# Patient Record
Sex: Female | Born: 1955 | Race: White | Hispanic: No | Marital: Married | Smoking: Former smoker
Health system: Southern US, Community
[De-identification: ages and names within clinical notes are randomized; demographics above are authoritative.]

## PROBLEM LIST (undated history)

## (undated) DIAGNOSIS — K5900 Constipation, unspecified: Secondary | ICD-10-CM

## (undated) DIAGNOSIS — E785 Hyperlipidemia, unspecified: Secondary | ICD-10-CM

## (undated) DIAGNOSIS — M797 Fibromyalgia: Secondary | ICD-10-CM

## (undated) DIAGNOSIS — T7840XA Allergy, unspecified, initial encounter: Secondary | ICD-10-CM

## (undated) DIAGNOSIS — E039 Hypothyroidism, unspecified: Secondary | ICD-10-CM

## (undated) DIAGNOSIS — K219 Gastro-esophageal reflux disease without esophagitis: Secondary | ICD-10-CM

## (undated) DIAGNOSIS — Z91018 Allergy to other foods: Secondary | ICD-10-CM

## (undated) DIAGNOSIS — J301 Allergic rhinitis due to pollen: Secondary | ICD-10-CM

## (undated) DIAGNOSIS — M810 Age-related osteoporosis without current pathological fracture: Secondary | ICD-10-CM

## (undated) DIAGNOSIS — M1990 Unspecified osteoarthritis, unspecified site: Secondary | ICD-10-CM

## (undated) DIAGNOSIS — F419 Anxiety disorder, unspecified: Secondary | ICD-10-CM

## (undated) HISTORY — DX: Allergic rhinitis due to pollen: J30.1

## (undated) HISTORY — PX: CARPAL TUNNEL RELEASE: SHX101

## (undated) HISTORY — DX: Hypothyroidism, unspecified: E03.9

## (undated) HISTORY — DX: Constipation, unspecified: K59.00

## (undated) HISTORY — DX: Fibromyalgia: M79.7

## (undated) HISTORY — PX: SINUS EXPLORATION: SHX5214

## (undated) HISTORY — DX: Allergy to other foods: Z91.018

## (undated) HISTORY — DX: Hyperlipidemia, unspecified: E78.5

## (undated) HISTORY — DX: Age-related osteoporosis without current pathological fracture: M81.0

## (undated) HISTORY — DX: Allergy, unspecified, initial encounter: T78.40XA

## (undated) HISTORY — DX: Gastro-esophageal reflux disease without esophagitis: K21.9

## (undated) HISTORY — PX: TONSILLECTOMY: SUR1361

## (undated) HISTORY — PX: FRACTURE SURGERY: SHX138

## (undated) HISTORY — PX: TUBAL LIGATION: SHX77

## (undated) HISTORY — PX: CATARACT EXTRACTION: SUR2

## (undated) HISTORY — DX: Unspecified osteoarthritis, unspecified site: M19.90

## (undated) HISTORY — PX: APPENDECTOMY: SHX54

## (undated) HISTORY — DX: Anxiety disorder, unspecified: F41.9

---

## 2016-01-23 ENCOUNTER — Ambulatory Visit (HOSPITAL_BASED_OUTPATIENT_CLINIC_OR_DEPARTMENT_OTHER): Payer: 59 | Attending: Internal Medicine | Admitting: Internal Medicine

## 2016-01-23 VITALS — Ht 63.0 in | Wt 185.0 lb

## 2016-01-23 DIAGNOSIS — R5383 Other fatigue: Secondary | ICD-10-CM | POA: Insufficient documentation

## 2016-01-23 DIAGNOSIS — Z6833 Body mass index (BMI) 33.0-33.9, adult: Secondary | ICD-10-CM | POA: Diagnosis not present

## 2016-01-23 DIAGNOSIS — R51 Headache: Secondary | ICD-10-CM | POA: Insufficient documentation

## 2016-01-23 DIAGNOSIS — G4733 Obstructive sleep apnea (adult) (pediatric): Secondary | ICD-10-CM

## 2016-01-23 DIAGNOSIS — E669 Obesity, unspecified: Secondary | ICD-10-CM | POA: Insufficient documentation

## 2016-01-23 DIAGNOSIS — R0602 Shortness of breath: Secondary | ICD-10-CM

## 2016-01-23 DIAGNOSIS — R0683 Snoring: Secondary | ICD-10-CM | POA: Diagnosis not present

## 2016-01-29 ENCOUNTER — Other Ambulatory Visit (HOSPITAL_BASED_OUTPATIENT_CLINIC_OR_DEPARTMENT_OTHER): Payer: Self-pay

## 2016-01-29 DIAGNOSIS — R0683 Snoring: Secondary | ICD-10-CM

## 2016-01-29 DIAGNOSIS — R0602 Shortness of breath: Secondary | ICD-10-CM

## 2016-02-06 ENCOUNTER — Telehealth: Payer: Self-pay | Admitting: Internal Medicine

## 2016-02-06 NOTE — Telephone Encounter (Signed)
CY please advise of the sleep study results that the pt had done on 9/7.  Pt is very upset that this has not been sent to Dr. Durene CalHunter.  Please advise. thanks

## 2016-02-06 NOTE — Telephone Encounter (Signed)
I apologize. We got behind while I was out of town, but we usually try to get studies processed and reports out in 2 weeks. I will take care of it.

## 2016-02-06 NOTE — Telephone Encounter (Signed)
Called pt to make aware of below.  Pt is also requesting that she be sent a copy of her sleep study once it is read.  Verified address on file.  Sleep study is not yet loaded into pt's chart, will keep message open to follow up on and send once sleep study is available.

## 2016-02-07 DIAGNOSIS — R0602 Shortness of breath: Secondary | ICD-10-CM

## 2016-02-07 NOTE — Procedures (Signed)
   Patient Name: Charlott RakesBagwell, Tulip Study Date: 01/23/2016 Gender: Female D.O.B: 12-04-1955 Age (years): [age] Referring Provider: Alvina Filbertenise Hunter MD Height (inches): 63 Interpreting Physician: Jetty Duhamellinton Teairra Millar MD, ABSM Weight (lbs): 185 RPSGT: Cherylann ParrDubili, Fred BMI: 33 MRN: [id number] Neck Size: 15.00 CLINICAL INFORMATION Sleep Study Type: NPSG Indication for sleep study: Fatigue, Morning Headaches, Obesity, Snoring, Witnesses Apnea / Gasping During Sleep Epworth Sleepiness Score: 3  SLEEP STUDY TECHNIQUE As per the AASM Manual for the Scoring of Sleep and Associated Events v2.3 (April 2016) with a hypopnea requiring 4% desaturations. The channels recorded and monitored were frontal, central and occipital EEG, electrooculogram (EOG), submentalis EMG (chin), nasal and oral airflow, thoracic and abdominal wall motion, anterior tibialis EMG, snore microphone, electrocardiogram, and pulse oximetry.  MEDICATIONS Patient's medications include: charted for review Medications self-administered by patient during sleep study : Sleep medicine administered - ALDACTONE, BENADRYL,  TRAZODONE, VALTREX, ZANTAC.  SLEEP ARCHITECTURE The study was initiated at 10:09:24 PM and ended at 5:03:30 AM. Sleep onset time was 9.9 minutes and the sleep efficiency was 86.3%. The total sleep time was 357.2 minutes. Stage REM latency was 208.0 minutes. The patient spent 10.22% of the night in stage N1 sleep, 71.72% in stage N2 sleep, 6.02% in stage N3 and 12.04% in REM. Alpha intrusion was absent. Supine sleep was 10.78%. Wake after sleep onset 47 minutes  RESPIRATORY PARAMETERS The overall apnea/hypopnea index (AHI) was 7.6 per hour. There were 21 total apneas, including 15 obstructive, 6 central and 0 mixed apneas. There were 24 hypopneas and 28 RERAs. The AHI during Stage REM sleep was 18.1 per hour. AHI while supine was 46.8 per hour. The mean oxygen saturation was 94.33%. The minimum SpO2 during sleep was  88.00%. Loud snoring was noted during this study.  CARDIAC DATA The 2 lead EKG demonstrated sinus rhythm. The mean heart rate was 72.72 beats per minute. Other EKG findings include: None.  LEG MOVEMENT DATA The total PLMS were 4 with a resulting PLMS index of 0.67. Associated arousal with leg movement index was 0.0 .  IMPRESSIONS - Mild obstructive sleep apnea occurred during this study (AHI = 7.6/h). - Most obstructive apneas were noted while patient was sleeping on back. - No significant central sleep apnea occurred during this study (CAI = 1.0/h). - The patient had minimal or no oxygen desaturation during the study (Min O2 = 88.00%) - The patient snored with Loud snoring volume. - No cardiac abnormalities were noted during this study. - Clinically significant periodic limb movements did not occur during sleep. No significant associated arousals.  DIAGNOSIS - Obstructive Sleep Apnea (327.23 [G47.33 ICD-10])  RECOMMENDATIONS - Positional therapy avoiding supine position during sleep. - Very mild obstructive sleep apnea. Return to discuss treatment options. - Avoid alcohol, sedatives and other CNS depressants that may worsen sleep apnea and disrupt normal sleep architecture. - Sleep hygiene should be reviewed to assess factors that may improve sleep quality. - Weight management and regular exercise should be initiated or continued if appropriate.  [Electronically signed] 02/07/2016 02:03 PM  Jetty Duhamellinton Camreigh Michie MD, ABSM Diplomate, American Board of Sleep Medicine   NPI: [id number]  Waymon BudgeYOUNG,Steph Cheadle D Diplomate, American Board of Sleep Medicine  ELECTRONICALLY SIGNED ON:  02/07/2016, 1:59 PM [facility] PH: [phone]   FX: [phone] ACCREDITED BY THE AMERICAN ACADEMY OF SLEEP MEDICINE

## 2016-02-10 NOTE — Telephone Encounter (Signed)
Copies of sleep study and results mailed to patient. Nothing further needed.

## 2016-03-27 ENCOUNTER — Telehealth: Payer: Self-pay | Admitting: Internal Medicine

## 2016-03-27 NOTE — Telephone Encounter (Signed)
Office closed at time of call-will send to CY to advise on form on Monday.

## 2016-03-30 NOTE — Telephone Encounter (Signed)
This is not for me. I have never seen this patient, just read a sleep study ordered by Dr Egbert [place]ibaldiBird [place]. Patient needs to talk with that doctor about treatment.

## 2016-03-30 NOTE — Telephone Encounter (Signed)
Spoke with Carolyn Welch the [facility] office is closed and all calls are forwarded to her. She is aware that we are not the ordering provider for oral appliance and that we are [facility] stating so. Nothing more needed at this time.

## 2020-05-23 ENCOUNTER — Encounter: Payer: Self-pay | Admitting: Internal Medicine

## 2020-06-27 ENCOUNTER — Ambulatory Visit: Payer: 59 | Admitting: Nurse Practitioner

## 2020-07-11 ENCOUNTER — Encounter: Payer: Self-pay | Admitting: Internal Medicine

## 2020-07-11 ENCOUNTER — Ambulatory Visit: Payer: 59 | Admitting: Nurse Practitioner

## 2020-07-11 NOTE — Progress Notes (Deleted)
Primary Care Physician:  Alvina Filbert, MD Primary Gastroenterologist:  Dr. Marletta Lor  No chief complaint on file.   HPI:   Carolyn Welch is a 65 y.o. female who presents on referral from primary care for GERD and to schedule a colonoscopy.  Reviewed information associated with referral including ***.  No history of colonoscopy found in our system.  Today she states is doing okay overall.  No past medical history on file.  *** The histories are not reviewed yet. Please review them in the "History" navigator section and refresh this SmartLink.  No current outpatient medications on file.   No current facility-administered medications for this visit.    Allergies as of 07/11/2020  . (Not on File)    No family history on file.  Social History   Socioeconomic History  . Marital status: Unknown    Spouse name: Not on file  . Number of children: Not on file  . Years of education: Not on file  . Highest education level: Not on file  Occupational History  . Not on file  Tobacco Use  . Smoking status: Not on file  . Smokeless tobacco: Not on file  Substance and Sexual Activity  . Alcohol use: Not on file  . Drug use: Not on file  . Sexual activity: Not on file  Other Topics Concern  . Not on file  Social History Narrative  . Not on file   Social Determinants of Health   Financial Resource Strain: Not on file  Food Insecurity: Not on file  Transportation Needs: Not on file  Physical Activity: Not on file  Stress: Not on file  Social Connections: Not on file  Intimate Partner Violence: Not on file    Subjective:*** Review of Systems  Constitutional: Negative for chills, fever, malaise/fatigue and weight loss.  HENT: Negative for congestion and sore throat.   Respiratory: Negative for cough and shortness of breath.   Cardiovascular: Negative for chest pain and palpitations.  Gastrointestinal: Negative for abdominal pain, blood in stool, diarrhea, melena,  nausea and vomiting.  Musculoskeletal: Negative for joint pain and myalgias.  Skin: Negative for rash.  Neurological: Negative for dizziness and weakness.  Endo/Heme/Allergies: Does not bruise/bleed easily.  Psychiatric/Behavioral: Negative for depression. The patient is not nervous/anxious.   All other systems reviewed and are negative.      Objective: There were no vitals taken for this visit. Physical Exam Vitals and nursing note reviewed.  Constitutional:      General: She is not in acute distress.    Appearance: Normal appearance. She is well-developed. She is not ill-appearing, toxic-appearing or diaphoretic.  HENT:     Head: Normocephalic and atraumatic.     Nose: No congestion or rhinorrhea.  Eyes:     General: No scleral icterus. Cardiovascular:     Rate and Rhythm: Normal rate and regular rhythm.     Heart sounds: Normal heart sounds.  Pulmonary:     Effort: Pulmonary effort is normal. No respiratory distress.     Breath sounds: Normal breath sounds.  Abdominal:     General: Bowel sounds are normal.     Palpations: Abdomen is soft. There is no hepatomegaly, splenomegaly or mass.     Tenderness: There is no abdominal tenderness. There is no guarding or rebound.     Hernia: No hernia is present.  Skin:    General: Skin is warm and dry.     Coloration: Skin is not jaundiced.  Findings: No rash.  Neurological:     General: No focal deficit present.     Mental Status: She is alert and oriented to person, place, and time.  Psychiatric:        Attention and Perception: Attention normal.        Mood and Affect: Mood normal.        Speech: Speech normal.        Behavior: Behavior normal.        Thought Content: Thought content normal.        Cognition and Memory: Cognition and memory normal.      Assessment:  ***   Plan: ***    Thank you for allowing [place] to participate in the care of [facility]  Wynne Dust, DNP, AGNP-C Adult & Gerontological Nurse  Practitioner [facility]   07/11/2020 8:53 AM   Disclaimer: This note was dictated with voice recognition software. Similar sounding words can inadvertently be transcribed and may not be corrected upon review.

## 2020-07-18 ENCOUNTER — Encounter: Payer: Self-pay | Admitting: Nurse Practitioner

## 2020-07-18 ENCOUNTER — Ambulatory Visit: Payer: 59 | Admitting: Nurse Practitioner

## 2020-07-18 ENCOUNTER — Other Ambulatory Visit: Payer: Self-pay

## 2020-07-18 ENCOUNTER — Telehealth: Payer: Self-pay

## 2020-07-18 DIAGNOSIS — Z86010 Personal history of colonic polyps: Secondary | ICD-10-CM

## 2020-07-18 DIAGNOSIS — K5900 Constipation, unspecified: Secondary | ICD-10-CM | POA: Diagnosis not present

## 2020-07-18 DIAGNOSIS — K219 Gastro-esophageal reflux disease without esophagitis: Secondary | ICD-10-CM | POA: Diagnosis not present

## 2020-07-18 MED ORDER — SUPREP BOWEL PREP KIT 17.5-3.13-1.6 GM/177ML PO SOLN: 1.0000 | ORAL | 0 refills | Status: DC

## 2020-07-18 NOTE — Progress Notes (Signed)
Primary Care Physician:  Abran Richard, MD Primary Gastroenterologist:  Dr. Abbey Chatters  Chief Complaint  Patient presents with  . Gastroesophageal Reflux    Doing okay now since starting omeprazole  . Consult    TCS done 02/2010.    HPI:   Carolyn Welch is a 65 y.o. female who presents on referral from primary care for GERD and to schedule colonoscopy. No information was found associated with the referral.  No history of colonoscopy in our system.  Today she states she doing okay overall. She was having a lot of GERD symptoms but she has since been started on omeprazole 20 mg daily which has helped a lot. She does have a history of GERD symptoms. Stress is a typical trigger. Symptoms well managed at this point. Chronic constipation which she manages with OTC medications. She states her last colonoscopy was about 2011 at [facility], and she is currently due. Previous GI Dr. Justus Memory at [place] (ph: [phone]; fax [phone]). Denies abdominal pain, N/V, hematochezia, melena, fever, chills, unintentional weight loss. Denies URI or flu-like symptoms. Denies loss of sense of taste or smell. The patient has received COVID-19 vaccination(s). They have also had a booster. Denies chest pain, dyspnea, dizziness, lightheadedness, syncope, near syncope. Denies any other upper or lower GI symptoms.  Past Medical History:  Diagnosis Date  . GERD (gastroesophageal reflux disease)   . Hypothyroidism     History reviewed. No pertinent surgical history.  Current Outpatient Medications  Medication Sig Dispense Refill  . levothyroxine (SYNTHROID) 125 MCG tablet Take 125 mcg by mouth daily.    . Liothyronine Sodium (CYTOMEL PO) SR 54m once a day    . omeprazole (PRILOSEC) 20 MG capsule Take 20 mg by mouth daily.    .Marland Kitchenspironolactone (ALDACTONE) 100 MG tablet Take 100 mg by mouth 2 (two) times daily.    . traZODone (DESYREL) 100 MG tablet at bedtime.     No  current facility-administered medications for this visit.    Allergies as of 07/18/2020 - Review Complete 07/18/2020  Allergen Reaction Noted  . Clindamycin Hives 12/06/2018  . Codeine  02/01/2013  . Doxycycline Hives 02/01/2013  . Other Other (See Comments) 04/30/2020    History reviewed. No pertinent family history.  Social History   Socioeconomic History  . Marital status: Unknown    Spouse name: Not on file  . Number of children: Not on file  . Years of education: Not on file  . Highest education level: Not on file  Occupational History  . Not on file  Tobacco Use  . Smoking status: Former SResearch scientist (life sciences) . Smokeless tobacco: Never Used  Substance and Sexual Activity  . Alcohol use: Yes    Comment: socially  . Drug use: Never  . Sexual activity: Not on file  Other Topics Concern  . Not on file  Social History Narrative  . Not on file   Social Determinants of Health   Financial Resource Strain: Not on file  Food Insecurity: Not on file  Transportation Needs: Not on file  Physical Activity: Not on file  Stress: Not on file  Social Connections: Not on file  Intimate Partner Violence: Not on file    Subjective: Review of Systems  Constitutional: Negative for chills, fever, malaise/fatigue and weight loss.  HENT: Negative for congestion and sore throat.   Respiratory: Negative for cough and shortness of breath.   Cardiovascular: Negative for chest pain and palpitations.  Gastrointestinal: Positive for constipation (  chronic, self-managed) and heartburn (improved on omeprazole). Negative for abdominal pain, blood in stool, diarrhea, melena, nausea and vomiting.  Musculoskeletal: Negative for joint pain and myalgias.  Skin: Negative for rash.  Neurological: Negative for dizziness and weakness.  Endo/Heme/Allergies: Does not bruise/bleed easily.  Psychiatric/Behavioral: Negative for depression. The patient is not nervous/anxious.   All other systems reviewed and are  negative.      Objective: BP 108/62   Pulse 68   Temp (!) 96.8 F (36 C)   Ht _0  (1.6 m)   Wt 147 lb 12.8 oz (67 kg)   BMI 26.18 kg/m  Physical Exam Vitals and nursing note reviewed.  Constitutional:      General: She is not in acute distress.    Appearance: Normal appearance. She is well-developed and normal weight. She is not ill-appearing, toxic-appearing or diaphoretic.  HENT:     Head: Normocephalic and atraumatic.     Nose: No congestion or rhinorrhea.  Eyes:     General: No scleral icterus. Cardiovascular:     Rate and Rhythm: Normal rate and regular rhythm.     Heart sounds: Normal heart sounds.  Pulmonary:     Effort: Pulmonary effort is normal. No respiratory distress.     Breath sounds: Normal breath sounds.  Abdominal:     General: Bowel sounds are normal.     Palpations: Abdomen is soft. There is no hepatomegaly, splenomegaly or mass.     Tenderness: There is no abdominal tenderness. There is no guarding or rebound.     Hernia: No hernia is present.  Skin:    General: Skin is warm and dry.     Coloration: Skin is not jaundiced.     Findings: No rash.  Neurological:     General: No focal deficit present.     Mental Status: She is alert and oriented to person, place, and time.  Psychiatric:        Attention and Perception: Attention normal.        Mood and Affect: Mood normal.        Speech: Speech normal.        Behavior: Behavior normal.        Thought Content: Thought content normal.        Cognition and Memory: Cognition and memory normal.      Assessment:  Pleasant 65 year old female presents on referral from primary care for GERD and schedule colonoscopy.  She also notes constipation is chronic symptoms since childhood.  No red flag/warning signs or symptoms.  GERD: Patient is having significant GERD symptoms.  She does take a regular amount of NSAIDs due to fibromyalgia.  States Tylenol does not work.  She was started on omeprazole 20 mg  once daily which has helped her symptoms significantly.  However, she states she does not want to be on an acid blocker forever because "it can prevent you from absorbing things."  We discussed the likely insidious nature of her symptoms given ongoing NSAID use and she will likely need ongoing PPI to prevent erosions, ulcers, and significant symptoms.  We can further address in the future if she would like to try to come off her medication.  Constipation: She states she has had constipation since childhood, will go up to 2 to 3 days before bowel movement at which point she will use a suppository if needed to help have bowel movement.  She asked about medications for constipation and we discussed daily medication such as Linzess  or Amitiza.  States she does not want to be on a daily medication.  I recommended she continue her current regimen in the meantime she can contact us if she would like to start regular constipation management  Need for colonoscopy: Per the patient her last colonoscopy in [place], [place] with benign polyps and recommended 10-year repeat.  She states this completed about 2011 and she is currently due.  We will request previous colonoscopy report.  We will proceed with scheduling a colonoscopy at this time, further recommendations to follow.   Proceed with TCS on propofol/MAC with Dr. Abbey Chatters on propofol/MAC in near future: the risks, benefits, and alternatives have been discussed with the patient in detail. The patient states understanding and desires to proceed.  ASA 2 (based on limited history in Epic)   Plan: 1. Request previous colonoscopy from [facility] 2. Continue PPI for now 3. Notify us if desire to start regular constipation management medications 4. Colonoscopy as described above 5. Return for follow-up based on post procedure recommendations or as needed.    Thank you for allowing [place] to participate in the care of [facility]  Walden Field, DNP, AGNP-C Adult & Gerontological Nurse Practitioner [facility]   07/18/2020 10:30 AM   Disclaimer: This note was dictated with voice recognition software. Similar sounding words can inadvertently be transcribed and may not be corrected upon review.

## 2020-07-18 NOTE — Patient Instructions (Signed)
Your health issues we discussed today were:   GERD (reflux/heartburn): 1. I am glad your low dose of omeprazole is helping your symptoms 2. As we discussed, you will likely have symptoms if you stop your medication especially given the ongoing use of NSAIDs (such as ibuprofen) 3. Continue taking omeprazole for now 4. Call us or schedule a follow-up visit if you would like to discuss coming off your medication 5. You can also discuss this with your primary care as well who prescribed the medication  Constipation: 1. I am glad you are over-the-counter regimen seems to be helping your constipation 2. Let us know if you would like to discuss daily medications to help manage her constipation  Need for colonoscopy: 1. We will schedule colonoscopy for you 2. Further recommendations will follow your colonoscopy 3. Call us if you have any concerning symptoms such as blood in your stools  Overall I recommend:  1. Continue other current medications 2. Return for follow-up in 2 months 3. Call us for any questions or concerns   ---------------------------------------------------------------  I am glad you have gotten your COVID-19 vaccination!  Even though you are fully vaccinated you should continue to follow CDC and state/local guidelines.  ---------------------------------------------------------------   At [facility] we value your feedback. You may receive a survey about your visit today. Please share your experience as we strive to create trusting relationships with our patients to provide genuine, compassionate, quality care.  We appreciate your understanding and patience as we review any laboratory studies, imaging, and other diagnostic tests that are ordered as we care for you. Our office policy is 5 business days for review of these results, and any emergent or urgent results are addressed in a timely manner for your best interest. If you do not hear from our office in 1  week, please contact us.   We also encourage the use of MyChart, which contains your medical information for your review as well. If you are not enrolled in this feature, an access code is on this after visit summary for your convenience. Thank you for allowing [place] to be involved in your care.  It was great to see you today!  I hope you have a great spring!!

## 2020-07-18 NOTE — Telephone Encounter (Signed)
PA for TCS submitted via [facility] website. PA# [id number], valid 08/26/20-11/24/20.

## 2020-08-01 ENCOUNTER — Telehealth: Payer: Self-pay | Admitting: Internal Medicine

## 2020-08-01 NOTE — Telephone Encounter (Signed)
Called pt. She has been rescheduled to 4/25 Monday. Aware will mail new prep instructions with new covid test appt. Called endo and LMOVM of appt change.

## 2020-08-01 NOTE — Telephone Encounter (Signed)
PATIENT NEEDS TO RESCHEDULE HER COLONOSCOPY

## 2020-08-23 ENCOUNTER — Other Ambulatory Visit (HOSPITAL_COMMUNITY): Payer: 59

## 2020-09-06 ENCOUNTER — Other Ambulatory Visit (HOSPITAL_COMMUNITY)
Admission: RE | Admit: 2020-09-06 | Discharge: 2020-09-06 | Disposition: A | Discharge status: Home / Self Care | Payer: 59 | Source: Ambulatory Visit | Attending: Internal Medicine | Admitting: Internal Medicine

## 2020-09-06 ENCOUNTER — Other Ambulatory Visit: Payer: Self-pay

## 2020-09-06 DIAGNOSIS — Z20822 Contact with and (suspected) exposure to covid-19: Secondary | ICD-10-CM | POA: Diagnosis not present

## 2020-09-06 DIAGNOSIS — Z01812 Encounter for preprocedural laboratory examination: Secondary | ICD-10-CM | POA: Diagnosis not present

## 2020-09-06 LAB — SARS CORONAVIRUS 2 (TAT 6-24 HRS): SARS Coronavirus 2: NEGATIVE

## 2020-09-06 NOTE — Progress Notes (Signed)
Pt.'s Covid order had not been put in the system. I put it in, released it and printed requisition. The specimen will go to the lab around 10:00. Pt. Has to have Covid test before her procedure can be done.

## 2020-09-09 ENCOUNTER — Ambulatory Visit (HOSPITAL_COMMUNITY): Payer: 59 | Admitting: Anesthesiology

## 2020-09-09 ENCOUNTER — Other Ambulatory Visit: Payer: Self-pay

## 2020-09-09 ENCOUNTER — Ambulatory Visit (HOSPITAL_COMMUNITY)
Admission: RE | Admit: 2020-09-09 | Discharge: 2020-09-09 | Disposition: A | Discharge status: Home / Self Care | Payer: 59 | Attending: Internal Medicine | Admitting: Internal Medicine

## 2020-09-09 ENCOUNTER — Encounter (HOSPITAL_COMMUNITY)
Admission: RE | Disposition: A | Discharge status: Home / Self Care | Payer: Self-pay | Source: Home / Self Care | Attending: Internal Medicine

## 2020-09-09 ENCOUNTER — Encounter (HOSPITAL_COMMUNITY): Payer: Self-pay

## 2020-09-09 DIAGNOSIS — Z1211 Encounter for screening for malignant neoplasm of colon: Secondary | ICD-10-CM | POA: Insufficient documentation

## 2020-09-09 DIAGNOSIS — Z79890 Hormone replacement therapy (postmenopausal): Secondary | ICD-10-CM | POA: Insufficient documentation

## 2020-09-09 DIAGNOSIS — Z79899 Other long term (current) drug therapy: Secondary | ICD-10-CM | POA: Diagnosis not present

## 2020-09-09 DIAGNOSIS — Z87891 Personal history of nicotine dependence: Secondary | ICD-10-CM | POA: Diagnosis not present

## 2020-09-09 DIAGNOSIS — Z881 Allergy status to other antibiotic agents status: Secondary | ICD-10-CM | POA: Insufficient documentation

## 2020-09-09 DIAGNOSIS — Z885 Allergy status to narcotic agent status: Secondary | ICD-10-CM | POA: Diagnosis not present

## 2020-09-09 DIAGNOSIS — K648 Other hemorrhoids: Secondary | ICD-10-CM | POA: Insufficient documentation

## 2020-09-09 DIAGNOSIS — K5730 Diverticulosis of large intestine without perforation or abscess without bleeding: Secondary | ICD-10-CM | POA: Diagnosis not present

## 2020-09-09 HISTORY — PX: COLONOSCOPY WITH PROPOFOL: SHX5780

## 2020-09-09 SURGERY — COLONOSCOPY WITH PROPOFOL
Anesthesia: General

## 2020-09-09 MED ORDER — PROPOFOL 500 MG/50ML IV EMUL
INTRAVENOUS | Status: DC | PRN
  Administered 2020-09-09: 125 ug/kg/min via INTRAVENOUS

## 2020-09-09 MED ORDER — LACTATED RINGERS IV SOLN: INTRAVENOUS | Status: DC

## 2020-09-09 MED ORDER — STERILE WATER FOR IRRIGATION IR SOLN
Status: DC | PRN
  Administered 2020-09-09: 1.5 mL

## 2020-09-09 MED ORDER — EPHEDRINE SULFATE 50 MG/ML IJ SOLN
INTRAMUSCULAR | Status: DC | PRN
  Administered 2020-09-09 (×2): 10 mg via INTRAVENOUS

## 2020-09-09 MED ORDER — PROPOFOL 10 MG/ML IV BOLUS
INTRAVENOUS | Status: DC | PRN
  Administered 2020-09-09: 100 mg via INTRAVENOUS
  Administered 2020-09-09: 20 mg via INTRAVENOUS

## 2020-09-09 NOTE — H&P (Signed)
Primary Care Physician:  Abran Richard, MD Primary Gastroenterologist:  Dr. Abbey Chatters  Pre-Procedure History & Physical: HPI:  Carolyn Welch is a 65 y.o. female is here for a colonoscopy for colon cancer screening purposes. Last TCS in 2011 in [place], [place] normal per patient.  Patient denies any family history of colorectal cancer.  No melena or hematochezia.  No abdominal pain or unintentional weight loss.  No change in bowel habits.  Overall feels well from a GI standpoint.  Past Medical History:  Diagnosis Date  . GERD (gastroesophageal reflux disease)   . Hypothyroidism     History reviewed. No pertinent surgical history.  Prior to Admission medications   Medication Sig Start Date End Date Taking? Authorizing Provider  Calcium Citrate-Vitamin D (CITRACAL + D PO) Take 1 tablet by mouth daily.   Yes [provider]  levothyroxine (SYNTHROID) 125 MCG tablet Take 125 mcg by mouth daily before breakfast. 07/04/20  Yes [provider]  liothyronine (CYTOMEL) 25 MCG tablet Take 25 mcg by mouth daily. SR 69m once a day   Yes [provider]  magnesium oxide (MAG-OX) 400 MG tablet Take 400 mg by mouth daily.   Yes [provider]  Na Sulfate-K Sulfate-Mg Sulf (SUPREP BOWEL PREP KIT) 17.5-3.13-1.6 GM/177ML SOLN Take 1 kit by mouth as directed. 07/18/20  Yes Deron Poole K, DO  Romosozumab-aqqg (EVENITY) 105 MG/1.17ML SOSY injection Inject 210 mg into the skin every 30 (thirty) days.   Yes [provider]  spironolactone (ALDACTONE) 100 MG tablet Take 100 mg by mouth 2 (two) times daily. 06/26/20  Yes [provider]  traZODone (DESYREL) 50 MG tablet Take 75 mg by mouth at bedtime. 06/24/20  Yes [provider]  venlafaxine XR (EFFEXOR-XR) 37.5 MG 24 hr capsule Take 37.5 mg by mouth daily. 08/19/20  Yes [provider]    Allergies as of 07/18/2020 - Review Complete 07/18/2020  Allergen Reaction Noted  . Clindamycin Hives 12/06/2018   . Codeine  02/01/2013  . Doxycycline Hives 02/01/2013  . Other Other (See Comments) 04/30/2020    History reviewed. No pertinent family history.  Social History   Socioeconomic History  . Marital status: Married    Spouse name: Not on file  . Number of children: Not on file  . Years of education: Not on file  . Highest education level: Not on file  Occupational History  . Not on file  Tobacco Use  . Smoking status: Former SResearch scientist (life sciences) . Smokeless tobacco: Never Used  Substance and Sexual Activity  . Alcohol use: Yes    Comment: socially  . Drug use: Never  . Sexual activity: Not on file  Other Topics Concern  . Not on file  Social History Narrative  . Not on file   Social Determinants of Health   Financial Resource Strain: Not on file  Food Insecurity: Not on file  Transportation Needs: Not on file  Physical Activity: Not on file  Stress: Not on file  Social Connections: Not on file  Intimate Partner Violence: Not on file    Review of Systems: See HPI, otherwise negative ROS  Physical Exam: Vital signs in last 24 hours: Temp:  [98.3 F (36.8 C)] 98.3 F (36.8 C) (04/25 0821) Pulse Rate:  [67] 67 (04/25 0821) Resp:  [20] 20 (04/25 0821) BP: (120)/(68) 120/68 (04/25 0821) SpO2:  [100 %] 100 % (04/25 0821) Weight:  [64.4 kg] 64.4 kg (04/25 0821)   General:   Alert,  Well-developed,  well-nourished, pleasant and cooperative in NAD Head:  Normocephalic and atraumatic. Eyes:  Sclera clear, no icterus.   Conjunctiva pink. Ears:  Normal auditory acuity. Nose:  No deformity, discharge,  or lesions. Mouth:  No deformity or lesions, dentition normal. Neck:  Supple; no masses or thyromegaly. Lungs:  Clear throughout to auscultation.   No wheezes, crackles, or rhonchi. No acute distress. Heart:  Regular rate and rhythm; no murmurs, clicks, rubs,  or gallops. Abdomen:  Soft, nontender and nondistended. No masses, hepatosplenomegaly or hernias noted. Normal bowel sounds,  without guarding, and without rebound.   Msk:  Symmetrical without gross deformities. Normal posture. Extremities:  Without clubbing or edema. Neurologic:  Alert and  oriented x4;  grossly normal neurologically. Skin:  Intact without significant lesions or rashes. Cervical Nodes:  No significant cervical adenopathy. Psych:  Alert and cooperative. Normal mood and affect.  Impression/Plan: Carolyn Welch is here for a colonoscopy to be performed for colon cancer screening purposes.  The risks of the procedure including infection, bleed, or perforation as well as benefits, limitations, alternatives and imponderables have been reviewed with the patient. Questions have been answered. All parties agreeable.

## 2020-09-09 NOTE — Anesthesia Preprocedure Evaluation (Signed)
Anesthesia Evaluation  Patient identified by MRN, date of birth, ID band Patient awake    Reviewed: Allergy & Precautions, NPO status , Patient's Chart, lab work & pertinent test results  Airway Mallampati: II  TM Distance: >3 FB Neck ROM: Full    Dental  (+) Dental Advisory Given,  Bridge :   Pulmonary former smoker,    Pulmonary exam normal breath sounds clear to auscultation       Cardiovascular Exercise Tolerance: Good Normal cardiovascular exam Rhythm:Regular Rate:Normal     Neuro/Psych negative neurological ROS  negative psych ROS   GI/Hepatic Neg liver ROS, GERD  Medicated and Controlled,  Endo/Other  Hypothyroidism   Renal/GU negative Renal ROS     Musculoskeletal negative musculoskeletal ROS (+)   Abdominal   Peds  Hematology negative hematology ROS (+)   Anesthesia Other Findings   Reproductive/Obstetrics negative OB ROS                             Anesthesia Physical Anesthesia Plan  ASA: II  Anesthesia Plan: General   Post-op Pain Management:    Induction: Intravenous  PONV Risk Score and Plan:   Airway Management Planned: Nasal Cannula and Natural Airway  Additional Equipment:   Intra-op Plan:   Post-operative Plan:   Informed Consent: I have reviewed the patients History and Physical, chart, labs and discussed the procedure including the risks, benefits and alternatives for the proposed anesthesia with the patient or authorized representative who has indicated his/her understanding and acceptance.     Dental advisory given  Plan Discussed with: CRNA and Surgeon  Anesthesia Plan Comments:         Anesthesia Quick Evaluation

## 2020-09-09 NOTE — Op Note (Signed)
[facility] Patient Name: Carolyn Welch Procedure Date: 09/09/2020 9:26 AM MRN: [id number] Date of Birth: 04-Jan-1956 Attending MD: Elon Alas. Abbey Chatters DO CSN: [id number] Age: 65 Admit Type: Outpatient Procedure:                Colonoscopy Indications:              Screening for colorectal malignant neoplasm Providers:                Elon Alas. Abbey Chatters, DO, Tacy Learn,                            Technician Referring MD:              Medicines:                See the Anesthesia note for documentation of the                            administered medications Complications:            No immediate complications. Estimated Blood Loss:     Estimated blood loss: none. Procedure:                Pre-Anesthesia Assessment:                           - The anesthesia plan was to use monitored                            anesthesia care (MAC).                           After obtaining informed consent, the colonoscope                            was passed under direct vision. Throughout the                            procedure, the patient's blood pressure, pulse, and                            oxygen saturations were monitored continuously. The                            [id number]-[id number] ([id number]) scope was introduced through                            the anus and advanced to the the cecum, identified                            by appendiceal orifice and ileocecal valve. The                            colonoscopy was performed without difficulty. The                            patient tolerated the procedure well. The quality  of the bowel preparation was evaluated using the                            BBPS [facility] Bowel Preparation Scale) with scores                            of: Right Colon = 2 (minor amount of residual                            staining, small fragments of stool and/or opaque                            liquid, but mucosa seen well),  Transverse Colon = 2                            (minor amount of residual staining, small fragments                            of stool and/or opaque liquid, but mucosa seen                            well) and Left Colon = 2 (minor amount of residual                            staining, small fragments of stool and/or opaque                            liquid, but mucosa seen well). The total BBPS score                            equals 6. The quality of the bowel preparation was                            fair. Scope In: 9:31:37 AM Scope Out: 9:45:05 AM Scope Withdrawal Time: 0 hours 8 minutes 36 seconds  Total Procedure Duration: 0 hours 13 minutes 28 seconds  Findings:      The perianal and digital rectal examinations were normal.      Non-bleeding internal hemorrhoids were found during endoscopy.      Multiple small and large-mouthed diverticula were found in the sigmoid       colon and descending colon. Impression:               - Preparation of the colon was fair.                           - Non-bleeding internal hemorrhoids.                           - Diverticulosis in the sigmoid colon and in the                            descending colon.                           -  No specimens collected. Moderate Sedation:      Per Anesthesia Care Recommendation:           - Patient has a contact number available for                            emergencies. The signs and symptoms of potential                            delayed complications were discussed with the                            patient. Return to normal activities tomorrow.                            Written discharge instructions were provided to the                            patient.                           - Resume previous diet.                           - Continue present medications.                           - Repeat colonoscopy in 10 years for screening                            purposes.                            - Return to GI clinic PRN. Procedure Code(s):        --- Professional ---                           T7001, Colorectal cancer screening; colonoscopy on                            individual not meeting criteria for high risk Diagnosis Code(s):        --- Professional ---                           Z12.11, Encounter for screening for malignant                            neoplasm of colon                           K64.8, Other hemorrhoids                           K57.30, Diverticulosis of large intestine without                            perforation or abscess without bleeding CPT copyright 2019 [employer].  All rights reserved. The codes documented in this report are preliminary and upon coder review may  be revised to meet current compliance requirements. Elon Alas. Abbey Chatters, DO Carlin Abbey Chatters, DO 09/09/2020 9:48:54 AM This report has been signed electronically. Number of Addenda: 0

## 2020-09-09 NOTE — Transfer of Care (Signed)
Immediate Anesthesia Transfer of Care Note  Patient: Carolyn Welch  Procedure(s) Performed: COLONOSCOPY WITH PROPOFOL (N/A )  Patient Location: PACU  Anesthesia Type:General  Level of Consciousness: awake and alert   Airway & Oxygen Therapy: Patient Spontanous Breathing  Post-op Assessment: Report given to RN and Post -op Vital signs reviewed and stable  Post vital signs: Reviewed and stable  Last Vitals:  Vitals Value Taken Time  BP    Temp    Pulse    Resp    SpO2      Last Pain:  Vitals:   09/09/20 0928  TempSrc:   PainSc: 0-No pain      Patients Stated Pain Goal: 7 (09/09/20 0821)  Complications: No complications documented.

## 2020-09-09 NOTE — Discharge Instructions (Addendum)
Colonoscopy Discharge Instructions  Read the instructions outlined below and refer to this sheet in the next few weeks. These discharge instructions provide you with general information on caring for yourself after you leave the hospital. Your doctor may also give you specific instructions. While your treatment has been planned according to the most current medical practices available, unavoidable complications occasionally occur.   ACTIVITY  You may resume your regular activity, but move at a slower pace for the next 24 hours.   Take frequent rest periods for the next 24 hours.   Walking will help get rid of the air and reduce the bloated feeling in your belly (abdomen).   No driving for 24 hours (because of the medicine (anesthesia) used during the test).    Do not sign any important legal documents or operate any machinery for 24 hours (because of the anesthesia used during the test).  NUTRITION  Drink plenty of fluids.   You may resume your normal diet as instructed by your doctor.   Begin with a light meal and progress to your normal diet. Heavy or fried foods are harder to digest and may make you feel sick to your stomach (nauseated).   Avoid alcoholic beverages for 24 hours or as instructed.  MEDICATIONS  You may resume your normal medications unless your doctor tells you otherwise.  WHAT YOU CAN EXPECT TODAY  Some feelings of bloating in the abdomen.   Passage of more gas than usual.   Spotting of blood in your stool or on the toilet paper.  IF YOU HAD POLYPS REMOVED DURING THE COLONOSCOPY:  No aspirin products for 7 days or as instructed.   No alcohol for 7 days or as instructed.   Eat a soft diet for the next 24 hours.  FINDING OUT THE RESULTS OF YOUR TEST Not all test results are available during your visit. If your test results are not back during the visit, make an appointment with your caregiver to find out the results. Do not assume everything is normal if  you have not heard from your caregiver or the medical facility. It is important for you to follow up on all of your test results.  SEEK IMMEDIATE MEDICAL ATTENTION IF:  You have more than a spotting of blood in your stool.   Your belly is swollen (abdominal distention).   You are nauseated or vomiting.   You have a temperature over 101.   You have abdominal pain or discomfort that is severe or gets worse throughout the day.   Your colonoscopy was relatively unremarkable.  I did not find any polyps or evidence of colon cancer.  You do have  diverticulosis and internal hemorrhoids. I would recommend increasing fiber in your diet or adding OTC Benefiber/Metamucil. Be sure to drink at least 4 to 6 glasses of water daily. Follow-up with GI as needed.   I hope you have a great rest of your week!  Hennie Duos. Marletta Lor, D.O. Gastroenterology and Hepatology [facility]   Diverticulosis  Diverticulosis is a condition that develops when small pouches (diverticula) form in the wall of the large intestine (colon). The colon is where water is absorbed and stool (feces) is formed. The pouches form when the inside layer of the colon pushes through weak spots in the outer layers of the colon. You may have a few pouches or many of them. The pouches usually do not cause problems unless they become inflamed or infected. When this happens, the condition  is called diverticulitis. What are the causes? The cause of this condition is not known. What increases the risk? The following factors may make you more likely to develop this condition:  Being older than age 64. Your risk for this condition increases with age. Diverticulosis is rare among people younger than age 64. By age 64, many people have it.  Eating a low-fiber diet.  Having frequent constipation.  Being overweight.  Not getting enough exercise.  Smoking.  Taking over-the-counter pain medicines, like aspirin and  ibuprofen.  Having a family history of diverticulosis. What are the signs or symptoms? In most people, there are no symptoms of this condition. If you do have symptoms, they may include:  Bloating.  Cramps in the abdomen.  Constipation or diarrhea.  Pain in the lower left side of the abdomen. How is this diagnosed? Because diverticulosis usually has no symptoms, it is most often diagnosed during an exam for other colon problems. The condition may be diagnosed by:  Using a flexible scope to examine the colon (colonoscopy).  Taking an X-ray of the colon after dye has been put into the colon (barium enema).  Having a CT scan. How is this treated? You may not need treatment for this condition. Your health care provider may recommend treatment to prevent problems. You may need treatment if you have symptoms or if you previously had diverticulitis. Treatment may include:  Eating a high-fiber diet.  Taking a fiber supplement.  Taking a live bacteria supplement (probiotic).  Taking medicine to relax your colon.   Follow these instructions at home: Medicines  Take over-the-counter and prescription medicines only as told by your health care provider.  If told by your health care provider, take a fiber supplement or probiotic. Constipation prevention Your condition may cause constipation. To prevent or treat constipation, you may need to:  Drink enough fluid to keep your urine pale yellow.  Take over-the-counter or prescription medicines.  Eat foods that are high in fiber, such as beans, whole grains, and fresh fruits and vegetables.  Limit foods that are high in fat and processed sugars, such as fried or sweet foods.   General instructions  Try not to strain when you have a bowel movement.  Keep all follow-up visits as told by your health care provider. This is important. Contact a health care provider if you:  Have pain in your abdomen.  Have bloating.  Have  cramps.  Have not had a bowel movement in 3 days. Get help right away if:  Your pain gets worse.  Your bloating becomes very bad.  You have a fever or chills, and your symptoms suddenly get worse.  You vomit.  You have bowel movements that are bloody or black.  You have bleeding from your rectum. Summary  Diverticulosis is a condition that develops when small pouches (diverticula) form in the wall of the large intestine (colon).  You may have a few pouches or many of them.  This condition is most often diagnosed during an exam for other colon problems.  Treatment may include increasing the fiber in your diet, taking supplements, or taking medicines. This information is not intended to replace advice given to you by your health care provider. Make sure you discuss any questions you have with your health care provider. Document Revised: 12/01/2018 Document Reviewed: 12/01/2018 Elsevier Patient Education  2021 [employer]  Hemorrhoids Hemorrhoids are swollen veins that may develop:  In the butt (rectum). These are called internal hemorrhoids.  Around the opening of the butt (anus). These are called external hemorrhoids. Hemorrhoids can cause pain, itching, or bleeding. Most of the time, they do not cause serious problems. They usually get better with diet changes, lifestyle changes, and other home treatments. What are the causes? This condition may be caused by:  Having trouble pooping (constipation).  Pushing hard (straining) to poop.  Watery poop (diarrhea).  Pregnancy.  Being very overweight (obese).  Sitting for long periods of time.  Heavy lifting or other activity that causes you to strain.  Anal sex.  Riding a bike for a long period of time. What are the signs or symptoms? Symptoms of this condition include:  Pain.  Itching or soreness in the butt.  Bleeding from the butt.  Leaking poop.  Swelling in the area.  One or more lumps around the  opening of your butt. How is this diagnosed? A doctor can often diagnose this condition by looking at the affected area. The doctor may also:  Do an exam that involves feeling the area with a gloved hand (digital rectal exam).  Examine the area inside your butt using a small tube (anoscope).  Order blood tests. This may be done if you have lost a lot of blood.  Have you get a test that involves looking inside the colon using a flexible tube with a camera on the end (sigmoidoscopy or colonoscopy). How is this treated? This condition can usually be treated at home. Your doctor may tell you to change what you eat, make lifestyle changes, or try home treatments. If these do not help, procedures can be done to remove the hemorrhoids or make them smaller. These may involve:  Placing rubber bands at the base of the hemorrhoids to cut off their blood supply.  Injecting medicine into the hemorrhoids to shrink them.  Shining a type of light energy onto the hemorrhoids to cause them to fall off.  Doing surgery to remove the hemorrhoids or cut off their blood supply. Follow these instructions at home: Eating and drinking  Eat foods that have a lot of fiber in them. These include whole grains, beans, nuts, fruits, and vegetables.  Ask your doctor about taking products that have added fiber (fibersupplements).  Reduce the amount of fat in your diet. You can do this by: ? Eating low-fat dairy products. ? Eating less red meat. ? Avoiding processed foods.  Drink enough fluid to keep your pee (urine) pale yellow.   Managing pain and swelling  Take a warm-water bath (sitz bath) for 20 minutes to ease pain. Do this 3-4 times a day. You may do this in a bathtub or using a portable sitz bath that fits over the toilet.  If told, put ice on the painful area. It may be helpful to use ice between your warm baths. ? Put ice in a plastic bag. ? Place a towel between your skin and the bag. ? Leave the  ice on for 20 minutes, 2-3 times a day.   General instructions  Take over-the-counter and prescription medicines only as told by your doctor. ? Medicated creams and medicines may be used as told.  Exercise often. Ask your doctor how much and what kind of exercise is best for you.  Go to the bathroom when you have the urge to poop. Do not wait.  Avoid pushing too hard when you poop.  Keep your butt dry and clean. Use wet toilet paper or moist towelettes after pooping.  Do not  sit on the toilet for a long time.  Keep all follow-up visits as told by your doctor. This is important. Contact a doctor if you:  Have pain and swelling that do not get better with treatment or medicine.  Have trouble pooping.  Cannot poop.  Have pain or swelling outside the area of the hemorrhoids. Get help right away if you have:  Bleeding that will not stop. Summary  Hemorrhoids are swollen veins in the butt or around the opening of the butt.  They can cause pain, itching, or bleeding.  Eat foods that have a lot of fiber in them. These include whole grains, beans, nuts, fruits, and vegetables.  Take a warm-water bath (sitz bath) for 20 minutes to ease pain. Do this 3-4 times a day. This information is not intended to replace advice given to you by your health care provider. Make sure you discuss any questions you have with your health care provider. Document Revised: 05/12/2018 Document Reviewed: 09/23/2017 Elsevier Patient Education  2021 [employer].  High-Fiber Eating Plan Fiber, also called dietary fiber, is a type of carbohydrate. It is found foods such as fruits, vegetables, whole grains, and beans. A high-fiber diet can have many health benefits. Your health care provider may recommend a high-fiber diet to help:  Prevent constipation. Fiber can make your bowel movements more regular.  Lower your cholesterol.  Relieve the following conditions: ? Inflammation of veins in the anus  (hemorrhoids). ? Inflammation of specific areas of the digestive tract (uncomplicated diverticulosis). ? A problem of the large intestine, also called the colon, that sometimes causes pain and diarrhea (irritable bowel syndrome, or IBS).  Prevent overeating as part of a weight-loss plan.  Prevent heart disease, type 2 diabetes, and certain cancers. What are tips for following this plan? Reading food labels  Check the nutrition facts label on food products for the amount of dietary fiber. Choose foods that have 5 grams of fiber or more per serving.  The goals for recommended daily fiber intake include: ? Men (age 29 or younger): 34-38 g. ? Men (over age 11): 28-34 g. ? Women (age 22 or younger): 25-28 g. ? Women (over age 43): 22-25 g. Your daily fiber goal is _____________ g.   Shopping  Choose whole fruits and vegetables instead of processed forms, such as apple juice or applesauce.  Choose a wide variety of high-fiber foods such as avocados, lentils, oats, and kidney beans.  Read the nutrition facts label of the foods you choose. Be aware of foods with added fiber. These foods often have high sugar and sodium amounts per serving. Cooking  Use whole-grain flour for baking and cooking.  Cook with brown rice instead of white rice. Meal planning  Start the day with a breakfast that is high in fiber, such as a cereal that contains 5 g of fiber or more per serving.  Eat breads and cereals that are made with whole-grain flour instead of refined flour or white flour.  Eat brown rice, bulgur wheat, or millet instead of white rice.  Use beans in place of meat in soups, salads, and pasta dishes.  Be sure that half of the grains you eat each day are whole grains. General information  You can get the recommended daily intake of dietary fiber by: ? Eating a variety of fruits, vegetables, grains, nuts, and beans. ? Taking a fiber supplement if you are not able to take in enough fiber  in your diet. It is better  to get fiber through food than from a supplement.  Gradually increase how much fiber you consume. If you increase your intake of dietary fiber too quickly, you may have bloating, cramping, or gas.  Drink plenty of water to help you digest fiber.  Choose high-fiber snacks, such as berries, raw vegetables, nuts, and popcorn. What foods should I eat? Fruits Berries. Pears. Apples. Oranges. Avocado. Prunes and raisins. Dried figs. Vegetables Sweet potatoes. Spinach. Kale. Artichokes. Cabbage. Broccoli. Cauliflower. Green peas. Carrots. Squash. Grains Whole-grain breads. Multigrain cereal. Oats and oatmeal. Brown rice. Barley. Bulgur wheat. Millet. Quinoa. Bran muffins. Popcorn. Rye wafer crackers. Meats and other proteins Navy beans, kidney beans, and pinto beans. Soybeans. Split peas. Lentils. Nuts and seeds. Dairy Fiber-fortified yogurt. Beverages Fiber-fortified soy milk. Fiber-fortified orange juice. Other foods Fiber bars. The items listed above may not be a complete list of recommended foods and beverages. Contact a dietitian for more information. What foods should I avoid? Fruits Fruit juice. Cooked, strained fruit. Vegetables Fried potatoes. Canned vegetables. Well-cooked vegetables. Grains White bread. Pasta made with refined flour. White rice. Meats and other proteins Fatty cuts of meat. Fried chicken or fried fish. Dairy Milk. Yogurt. Cream cheese. Sour cream. Fats and oils Butters. Beverages Soft drinks. Other foods Cakes and pastries. The items listed above may not be a complete list of foods and beverages to avoid. Talk with your dietitian about what choices are best for you. Summary  Fiber is a type of carbohydrate. It is found in foods such as fruits, vegetables, whole grains, and beans.  A high-fiber diet has many benefits. It can help to prevent constipation, lower blood cholesterol, aid weight loss, and reduce your risk of heart  disease, diabetes, and certain cancers.  Increase your intake of fiber gradually. Increasing fiber too quickly may cause cramping, bloating, and gas. Drink plenty of water while you increase the amount of fiber you consume.  The best sources of fiber include whole fruits and vegetables, whole grains, nuts, seeds, and beans. This information is not intended to replace advice given to you by your health care provider. Make sure you discuss any questions you have with your health care provider. Document Revised: 09/07/2019 Document Reviewed: 09/07/2019 Elsevier Patient Education  2021 [employer].

## 2020-09-09 NOTE — Anesthesia Postprocedure Evaluation (Signed)
Anesthesia Post Note  Patient: Subrena Devereux  Procedure(s) Performed: COLONOSCOPY WITH PROPOFOL (N/A )  Patient location during evaluation: Endoscopy Anesthesia Type: General Level of consciousness: awake and alert and oriented Pain management: pain level controlled Vital Signs Assessment: post-procedure vital signs reviewed and stable Respiratory status: spontaneous breathing and respiratory function stable Cardiovascular status: blood pressure returned to baseline and stable Postop Assessment: no apparent nausea or vomiting Anesthetic complications: no   No complications documented.   Last Vitals:  Vitals:   09/09/20 0821 09/09/20 0948  BP: 120/68 (!) 91/40  Pulse: 67 79  Resp: 20 18  Temp: 36.8 C 36.7 C  SpO2: 100% 99%    Last Pain:  Vitals:   09/09/20 0948  TempSrc: Oral  PainSc: 0-No pain                 Hoa Deriso C Keenya Matera

## 2020-09-12 ENCOUNTER — Encounter (HOSPITAL_COMMUNITY): Payer: Self-pay | Admitting: Internal Medicine

## 2020-11-05 LAB — TSH: TSH: 0.03 — AB (ref 0.41–5.90)

## 2021-02-15 ENCOUNTER — Encounter: Payer: Self-pay | Admitting: Gastroenterology

## 2021-02-15 NOTE — Progress Notes (Signed)
  Referring Provider: Hunter, Denise, MD Primary Care Physician:  Hunter, Denise, MD Primary GI Physician: Dr. Carver  Chief Complaint  Patient presents with   Gastroesophageal Reflux    constant   Constipation   Abdominal Pain    Upper abd    HPI:   Carolyn Welch is a 64 y.o. female presenting today for follow-up of GERD and following recent colonoscopy for colon cancer screening. Her chief complaint today is GERD, dysphagia, upper abdominal pain, constipation.  Last seen in our office at the time of initial consult on 07/18/2020.  Reported having a lot of GERD symptoms and had recently been started on omeprazole 20 mg daily which was helping quite a bit and felt symptoms were well managed.  Admitted to regular NSAID use due to fibromyalgia.  Also with chronic constipation that she manages with suppositories as needed.  No other significant GI symptoms.  She was advised to continue omeprazole.  Discussed possibility of Linzess or Amitiza, but patient did not want to be on a daily medication for constipation.  She was scheduled for screening colonoscopy.  Colonoscopy 09/09/2020: Nonbleeding internal hemorrhoids, diverticulosis in the sigmoid and descending colon.  Repeat in 10 years.  Today: GERD: Constant reflux symptoms. Wakes up with reflux at times. Taking tums, gaviscon, and started omeprazole again about 1 week ago. Had taken it for about 28 days with significant improvement in Feb/March. Eats pretty lean. Some spicy foods. Drink 1-2 carbonated beverages daily. Occasional snack at night. Gained 11 lbs since last visit. Occasional dysphagia with solid foods and pills. No trouble with liquids or soft foods. Occurs daily. Started a few months ago. EGD years ago in [place], no ulcer. No prior dilation. Maternal Aunt has history of esophagus cancer.   Also with epigastric abdominal pain. Had been taking 2 ibuprofen TID for back problems and fibromyalgia. Started Cymbalta which is helping with  chronic pain. Now taking ibuprofen as needed- maybe 1 a day a few days a week.  Abdominal pain was worse when taking ibuprofen. Started about 6 months ago. Still occurs daily. Described as burning/gnawing/uncomfortable sensation.  Some relief with eating. No brbpr or melena.   Constipation: Chronic. Uses MOM or suppository as needed. Bowels move every couple days.   Past Medical History:  Diagnosis Date   Fibromyalgia    GERD (gastroesophageal reflux disease)    Hypothyroidism     Past Surgical History:  Procedure Laterality Date   COLONOSCOPY WITH PROPOFOL N/A 09/09/2020   Surgeon: Carver, Charles K, DO;  Nonbleeding internal hemorrhoids, diverticulosis in the sigmoid and descending colon.  Repeat in 10 years.    Current Outpatient Medications  Medication Sig Dispense Refill   Alum Hydroxide-Mag Carbonate (GAVISCON PO) Take by mouth as needed.     Calcium Carbonate Antacid (TUMS PO) Take by mouth as needed.     Calcium Citrate-Vitamin D (CITRACAL + D PO) Take 1 tablet by mouth daily.     DULoxetine (CYMBALTA) 60 MG capsule Take 60 mg by mouth daily.     levothyroxine (SYNTHROID) 88 MCG tablet Take 88 mcg by mouth daily.     magnesium oxide (MAG-OX) 400 MG tablet Take 400 mg by mouth daily.     Multiple Vitamin (MULTIVITAMIN) tablet Take 1 tablet by mouth daily.     Omega-3 Fatty Acids (FISH OIL PO) Take by mouth daily.     pantoprazole (PROTONIX) 40 MG tablet Take 1 tablet (40 mg total) by mouth daily. 30 tablet 3     spironolactone (ALDACTONE) 100 MG tablet Take 100 mg by mouth 2 (two) times daily.     traZODone (DESYREL) 50 MG tablet Take 100 mg by mouth at bedtime.     No current facility-administered medications for this visit.    Allergies as of 02/17/2021 - Review Complete 02/17/2021  Allergen Reaction Noted   Cephalosporins Hives 08/27/2020   Clindamycin Hives 12/06/2018   Codeine  02/01/2013   Doxycycline Hives 02/01/2013    Family History  Problem Relation Age of  Onset   Colon cancer Paternal Grandmother        diagnosed in her 90s   Esophageal cancer Maternal Aunt     Social History   Socioeconomic History   Marital status: Married    Spouse name: Not on file   Number of children: Not on file   Years of education: Not on file   Highest education level: Not on file  Occupational History   Not on file  Tobacco Use   Smoking status: Former   Smokeless tobacco: Never  Substance and Sexual Activity   Alcohol use: Yes    Comment: socially   Drug use: Never   Sexual activity: Not on file  Other Topics Concern   Not on file  Social History Narrative   Not on file   Social Determinants of Health   Financial Resource Strain: Not on file  Food Insecurity: Not on file  Transportation Needs: Not on file  Physical Activity: Not on file  Stress: Not on file  Social Connections: Not on file    Review of Systems: Gen: Denies fever, chills, cold or flulike symptoms, presyncope, syncope. CV: Denies chest pain, palpitations. Resp: Denies dyspnea or cough. GI: See HPI  Heme: See HPI  Physical Exam: BP 124/74   Pulse 74   Temp (!) 96.6 F (35.9 C) (Temporal)   Ht 5' 1" (1.549 m)   Wt 158 lb 3.2 oz (71.8 kg)   BMI 29.89 kg/m  General:   Alert and oriented. No distress noted. Pleasant and cooperative.  Head:  Normocephalic and atraumatic. Eyes:  Conjuctiva clear without scleral icterus. Heart:  S1, S2 present without murmurs appreciated. Lungs:  Clear to auscultation bilaterally. No wheezes, rales, or rhonchi. No distress.  Abdomen:  +BS, soft, and non-distended. Mild epigastric tenderness with somewhat more prominent RUQ tenderness. No rebound or guarding. No HSM or masses noted. Msk:  Symmetrical without gross deformities. Normal posture. Extremities:  Without edema. Neurologic:  Alert and  oriented x4 Psych:  Normal mood and affect.    Assessment: 64-year-old female with history of GERD, hypothyroidism, fibromyalgia presenting  today with chief complaint of uncontrolled GERD, upper abdominal pain, dysphagia, and chronic constipation.  GERD: Uncontrolled with Tums, Gaviscon, and recently resumed omeprazole 20 mg daily 1 week ago with no improvement.  Previously, took omeprazole for about 28 days in February/March with significant improvement in symptoms.  Associated dysphagia and also reports upper abdominal pain discussed below.  Will stop omeprazole and start Protonix 40 mg daily.  Dysphagia: Few month history of solid food and pill dysphagia in the setting of uncontrolled GERD.  Reports EGD many years ago in [place], but no prior dilation.  Symptoms may be secondary to GERD, esophagitis, esophageal web, ring, stricture, EOE, and less likely malignancy though her maternal aunt had esophageal cancer.  We will pursue EGD +/- dilation for further evaluation and therapeutic intervention.  Upper abdominal pain: 6-month history of upper abdominal pain described as burning/gnawing in the   setting of GERD and significant NSAID use.  Patient reports some improvement in severity since decreasing ibuprofen, but continues with constant "uncomfortable" sensation that actually improves somewhat with eating.  Denies BRBPR or melena.  On exam, she has mild TTP in the epigastric area and mild to moderate TTP in the RUQ region.  Symptoms are concerning for peptic ulcer disease and/or gastritis, possible H. pylori for which I have recommended EGD for further evaluation.  Though symptoms are not consistent with biliary etiology, considering increased tenderness in the RUQ region, we will also arrange RUQ ultrasound as well as labs.   Constipation: Chronic.  Currently using milk of magnesia or suppositories as needed with bowel movements every couple of days.  No alarm symptoms.  Colonoscopy up-to-date in April 2022, due for repeat in 2032.  Recommended daily regimen of MiraLAX and let me know if symptoms do not improve.   Plan: CBC, CMP, lipase. RUQ  ultrasound. EGD with possible dilation with propofol with Dr. Marletta Lor in the near future. The risks, benefits, and alternatives have been discussed with the patient in detail. The patient states understanding and desires to proceed. ASA II BMP at preop Stop omeprazole and start Protonix 40 mg daily. Reinforced GERD diet/lifestyle. Limit NSAIDs as much as possible. Tylenol for pain.  No more than 3000 mg of Tylenol per day. MiraLAX 1 capful (17 g) daily in 8 ounces of water.  Patient is to let me know if this is not effective. Follow-up after procedure.    Ermalinda Memos, PA-C [facility] 02/17/2021

## 2021-02-15 NOTE — H&P (View-Only) (Signed)
Referring Provider: Alvina Filbert, MD Primary Care Physician:  Alvina Filbert, MD Primary GI Physician: Dr. Marletta Lor  Chief Complaint  Patient presents with   Gastroesophageal Reflux    constant   Constipation   Abdominal Pain    Upper abd    HPI:   Carolyn Welch is a 65 y.o. female presenting today for follow-up of GERD and following recent colonoscopy for colon cancer screening. Her chief complaint today is GERD, dysphagia, upper abdominal pain, constipation.  Last seen in our office at the time of initial consult on 07/18/2020.  Reported having a lot of GERD symptoms and had recently been started on omeprazole 20 mg daily which was helping quite a bit and felt symptoms were well managed.  Admitted to regular NSAID use due to fibromyalgia.  Also with chronic constipation that she manages with suppositories as needed.  No other significant GI symptoms.  She was advised to continue omeprazole.  Discussed possibility of Linzess or Amitiza, but patient did not want to be on a daily medication for constipation.  She was scheduled for screening colonoscopy.  Colonoscopy 09/09/2020: Nonbleeding internal hemorrhoids, diverticulosis in the sigmoid and descending colon.  Repeat in 10 years.  Today: GERD: Constant reflux symptoms. Wakes up with reflux at times. Taking tums, gaviscon, and started omeprazole again about 1 week ago. Had taken it for about 28 days with significant improvement in Feb/March. Eats pretty lean. Some spicy foods. Drink 1-2 carbonated beverages daily. Occasional snack at night. Gained 11 lbs since last visit. Occasional dysphagia with solid foods and pills. No trouble with liquids or soft foods. Occurs daily. Started a few months ago. EGD years ago in [place], no ulcer. No prior dilation. Maternal Aunt has history of esophagus cancer.   Also with epigastric abdominal pain. Had been taking 2 ibuprofen TID for back problems and fibromyalgia. Started Cymbalta which is helping with  chronic pain. Now taking ibuprofen as needed- maybe 1 a day a few days a week.  Abdominal pain was worse when taking ibuprofen. Started about 6 months ago. Still occurs daily. Described as burning/gnawing/uncomfortable sensation.  Some relief with eating. No brbpr or melena.   Constipation: Chronic. Uses MOM or suppository as needed. Bowels move every couple days.   Past Medical History:  Diagnosis Date   Fibromyalgia    GERD (gastroesophageal reflux disease)    Hypothyroidism     Past Surgical History:  Procedure Laterality Date   COLONOSCOPY WITH PROPOFOL N/A 09/09/2020   Surgeon: Lanelle Bal, DO;  Nonbleeding internal hemorrhoids, diverticulosis in the sigmoid and descending colon.  Repeat in 10 years.    Current Outpatient Medications  Medication Sig Dispense Refill   Alum Hydroxide-Mag Carbonate (GAVISCON PO) Take by mouth as needed.     Calcium Carbonate Antacid (TUMS PO) Take by mouth as needed.     Calcium Citrate-Vitamin D (CITRACAL + D PO) Take 1 tablet by mouth daily.     DULoxetine (CYMBALTA) 60 MG capsule Take 60 mg by mouth daily.     levothyroxine (SYNTHROID) 88 MCG tablet Take 88 mcg by mouth daily.     magnesium oxide (MAG-OX) 400 MG tablet Take 400 mg by mouth daily.     Multiple Vitamin (MULTIVITAMIN) tablet Take 1 tablet by mouth daily.     Omega-3 Fatty Acids (FISH OIL PO) Take by mouth daily.     pantoprazole (PROTONIX) 40 MG tablet Take 1 tablet (40 mg total) by mouth daily. 30 tablet 3  spironolactone (ALDACTONE) 100 MG tablet Take 100 mg by mouth 2 (two) times daily.     traZODone (DESYREL) 50 MG tablet Take 100 mg by mouth at bedtime.     No current facility-administered medications for this visit.    Allergies as of 02/17/2021 - Review Complete 02/17/2021  Allergen Reaction Noted   Cephalosporins Hives 08/27/2020   Clindamycin Hives 12/06/2018   Codeine  02/01/2013   Doxycycline Hives 02/01/2013    Family History  Problem Relation Age of  Onset   Colon cancer Paternal Grandmother        diagnosed in her 61s   Esophageal cancer Maternal Aunt     Social History   Socioeconomic History   Marital status: Married    Spouse name: Not on file   Number of children: Not on file   Years of education: Not on file   Highest education level: Not on file  Occupational History   Not on file  Tobacco Use   Smoking status: Former   Smokeless tobacco: Never  Substance and Sexual Activity   Alcohol use: Yes    Comment: socially   Drug use: Never   Sexual activity: Not on file  Other Topics Concern   Not on file  Social History Narrative   Not on file   Social Determinants of Health   Financial Resource Strain: Not on file  Food Insecurity: Not on file  Transportation Needs: Not on file  Physical Activity: Not on file  Stress: Not on file  Social Connections: Not on file    Review of Systems: Gen: Denies fever, chills, cold or flulike symptoms, presyncope, syncope. CV: Denies chest pain, palpitations. Resp: Denies dyspnea or cough. GI: See HPI  Heme: See HPI  Physical Exam: BP 124/74   Pulse 74   Temp (!) 96.6 F (35.9 C) (Temporal)   Ht 5\' 1"  (1.549 m)   Wt 158 lb 3.2 oz (71.8 kg)   BMI 29.89 kg/m  General:   Alert and oriented. No distress noted. Pleasant and cooperative.  Head:  Normocephalic and atraumatic. Eyes:  Conjuctiva clear without scleral icterus. Heart:  S1, S2 present without murmurs appreciated. Lungs:  Clear to auscultation bilaterally. No wheezes, rales, or rhonchi. No distress.  Abdomen:  +BS, soft, and non-distended. Mild epigastric tenderness with somewhat more prominent RUQ tenderness. No rebound or guarding. No HSM or masses noted. Msk:  Symmetrical without gross deformities. Normal posture. Extremities:  Without edema. Neurologic:  Alert and  oriented x4 Psych:  Normal mood and affect.    Assessment: 65 year old female with history of GERD, hypothyroidism, fibromyalgia presenting  today with chief complaint of uncontrolled GERD, upper abdominal pain, dysphagia, and chronic constipation.  GERD: Uncontrolled with Tums, Gaviscon, and recently resumed omeprazole 20 mg daily 1 week ago with no improvement.  Previously, took omeprazole for about 28 days in February/March with significant improvement in symptoms.  Associated dysphagia and also reports upper abdominal pain discussed below.  Will stop omeprazole and start Protonix 40 mg daily.  Dysphagia: Few month history of solid food and pill dysphagia in the setting of uncontrolled GERD.  Reports EGD many years ago in [place], but no prior dilation.  Symptoms may be secondary to GERD, esophagitis, esophageal web, ring, stricture, EOE, and less likely malignancy though her maternal aunt had esophageal cancer.  We will pursue EGD +/- dilation for further evaluation and therapeutic intervention.  Upper abdominal pain: 59-month history of upper abdominal pain described as burning/gnawing in the  setting of GERD and significant NSAID use.  Patient reports some improvement in severity since decreasing ibuprofen, but continues with constant "uncomfortable" sensation that actually improves somewhat with eating.  Denies BRBPR or melena.  On exam, she has mild TTP in the epigastric area and mild to moderate TTP in the RUQ region.  Symptoms are concerning for peptic ulcer disease and/or gastritis, possible H. pylori for which I have recommended EGD for further evaluation.  Though symptoms are not consistent with biliary etiology, considering increased tenderness in the RUQ region, we will also arrange RUQ ultrasound as well as labs.   Constipation: Chronic.  Currently using milk of magnesia or suppositories as needed with bowel movements every couple of days.  No alarm symptoms.  Colonoscopy up-to-date in April 2022, due for repeat in 2032.  Recommended daily regimen of MiraLAX and let me know if symptoms do not improve.   Plan: CBC, CMP, lipase. RUQ  ultrasound. EGD with possible dilation with propofol with Dr. Marletta Lor in the near future. The risks, benefits, and alternatives have been discussed with the patient in detail. The patient states understanding and desires to proceed. ASA II BMP at preop Stop omeprazole and start Protonix 40 mg daily. Reinforced GERD diet/lifestyle. Limit NSAIDs as much as possible. Tylenol for pain.  No more than 3000 mg of Tylenol per day. MiraLAX 1 capful (17 g) daily in 8 ounces of water.  Patient is to let me know if this is not effective. Follow-up after procedure.    Ermalinda Memos, PA-C [facility] 02/17/2021

## 2021-02-17 ENCOUNTER — Encounter: Payer: Self-pay | Admitting: Gastroenterology

## 2021-02-17 ENCOUNTER — Telehealth: Payer: Self-pay | Admitting: *Deleted

## 2021-02-17 ENCOUNTER — Encounter: Payer: Self-pay | Admitting: *Deleted

## 2021-02-17 ENCOUNTER — Ambulatory Visit: Payer: 59 | Admitting: Gastroenterology

## 2021-02-17 VITALS — BP 124/74 | HR 74 | Temp 96.6°F | Ht 61.0 in | Wt 158.2 lb

## 2021-02-17 DIAGNOSIS — R10811 Right upper quadrant abdominal tenderness: Secondary | ICD-10-CM

## 2021-02-17 DIAGNOSIS — R1010 Upper abdominal pain, unspecified: Secondary | ICD-10-CM | POA: Diagnosis not present

## 2021-02-17 DIAGNOSIS — K5900 Constipation, unspecified: Secondary | ICD-10-CM

## 2021-02-17 DIAGNOSIS — K219 Gastro-esophageal reflux disease without esophagitis: Secondary | ICD-10-CM

## 2021-02-17 DIAGNOSIS — R1310 Dysphagia, unspecified: Secondary | ICD-10-CM | POA: Insufficient documentation

## 2021-02-17 MED ORDER — PANTOPRAZOLE SODIUM 40 MG PO TBEC: 40.0000 mg | DELAYED_RELEASE_TABLET | Freq: Every day | ORAL | 3 refills | Status: DC

## 2021-02-17 NOTE — Patient Instructions (Addendum)
Please have labs completed at [facility].  We will arrange for you to have an ultrasound of your gallbladder in the near future.  We will arrange to have an upper endoscopy with possible dilation of your esophagus in the near future with Dr. Marletta Lor.  Start Protonix 40 mg daily for reflux.  Take this medication at least 2 to 3 hours after taking Synthroid.  Follow a GERD diet:  Avoid fried, fatty, greasy, spicy, citrus foods. Avoid caffeine and carbonated beverages. Avoid chocolate. Try eating 4-6 small meals a day rather than 3 large meals. Do not eat within 3 hours of laying down. Prop head of bed up on wood or bricks to create a 6 inch incline.  Limit all NSAID products as much as possible including ibuprofen, Aleve, Advil, Goody powders, BC powders, naproxen, and anything that says "NSAID" on the package.  I recommend use Tylenol first for pain.  No more than 3000 mg/day.  For constipation: Start MiraLAX 1 capful (17 g) daily in 8 ounces of water. Please let me know if this does not work well for you.  We will plan to follow-up with you in the office after your procedure.  It was a pleasure meeting you today!  Ermalinda Memos, PA-C [facility]

## 2021-02-17 NOTE — Telephone Encounter (Signed)
PA approved via [facility]. Auth# [id number], DOS 03/11/2021-06/09/2021

## 2021-02-18 ENCOUNTER — Telehealth: Payer: Self-pay

## 2021-02-18 LAB — CBC WITH DIFFERENTIAL/PLATELET
Absolute Monocytes: 548 cells/uL (ref 200–950)
Basophils Absolute: 90 cells/uL (ref 0–200)
Basophils Relative: 1.2 %
Eosinophils Absolute: 113 cells/uL (ref 15–500)
Eosinophils Relative: 1.5 %
HCT: 42.8 % (ref 35.0–45.0)
Hemoglobin: 14.6 g/dL (ref 11.7–15.5)
Lymphs Abs: 3128 cells/uL (ref 850–3900)
MCH: 34 pg — ABNORMAL HIGH (ref 27.0–33.0)
MCHC: 34.1 g/dL (ref 32.0–36.0)
MCV: 99.5 fL (ref 80.0–100.0)
MPV: 10.2 fL (ref 7.5–12.5)
Monocytes Relative: 7.3 %
Neutro Abs: 3623 cells/uL (ref 1500–7800)
Neutrophils Relative %: 48.3 %
Platelets: 271 10*3/uL (ref 140–400)
RBC: 4.3 10*6/uL (ref 3.80–5.10)
RDW: 12.4 % (ref 11.0–15.0)
Total Lymphocyte: 41.7 %
WBC: 7.5 10*3/uL (ref 3.8–10.8)

## 2021-02-18 LAB — COMPLETE METABOLIC PANEL WITH GFR
AG Ratio: 2.4 (calc) (ref 1.0–2.5)
ALT: 23 U/L (ref 6–29)
AST: 26 U/L (ref 10–35)
Albumin: 4.7 g/dL (ref 3.6–5.1)
Alkaline phosphatase (APISO): 53 U/L (ref 37–153)
BUN: 16 mg/dL (ref 7–25)
CO2: 29 mmol/L (ref 20–32)
Calcium: 9.8 mg/dL (ref 8.6–10.4)
Chloride: 101 mmol/L (ref 98–110)
Creat: 0.92 mg/dL (ref 0.50–1.05)
Globulin: 2 g/dL (calc) (ref 1.9–3.7)
Glucose, Bld: 93 mg/dL (ref 65–139)
Potassium: 4.5 mmol/L (ref 3.5–5.3)
Sodium: 138 mmol/L (ref 135–146)
Total Bilirubin: 0.5 mg/dL (ref 0.2–1.2)
Total Protein: 6.7 g/dL (ref 6.1–8.1)
eGFR: 70 mL/min/{1.73_m2} (ref >=60)

## 2021-02-18 LAB — LIPASE: Lipase: 16 U/L (ref 7–60)

## 2021-02-18 NOTE — Telephone Encounter (Signed)
Pt approved for Pantoprazole Sodium 40 mg tab. Dx used: K21.9, R10.10. pt is approved for 02/18/2021 to 02/18/2024 as long as the pt remain covered by Rx drug plan and there are no changes to the plan benefits.

## 2021-02-24 ENCOUNTER — Ambulatory Visit (HOSPITAL_COMMUNITY): Payer: 59

## 2021-03-07 ENCOUNTER — Other Ambulatory Visit: Payer: Self-pay

## 2021-03-07 ENCOUNTER — Other Ambulatory Visit (HOSPITAL_COMMUNITY)
Admission: RE | Admit: 2021-03-07 | Discharge: 2021-03-07 | Disposition: A | Discharge status: Home / Self Care | Payer: 59 | Source: Ambulatory Visit | Attending: Internal Medicine | Admitting: Internal Medicine

## 2021-03-07 ENCOUNTER — Ambulatory Visit (HOSPITAL_COMMUNITY)
Admission: RE | Admit: 2021-03-07 | Discharge: 2021-03-07 | Disposition: A | Discharge status: Home / Self Care | Payer: 59 | Source: Ambulatory Visit | Attending: Gastroenterology | Admitting: Gastroenterology

## 2021-03-07 DIAGNOSIS — R1010 Upper abdominal pain, unspecified: Secondary | ICD-10-CM

## 2021-03-07 DIAGNOSIS — R10811 Right upper quadrant abdominal tenderness: Secondary | ICD-10-CM

## 2021-03-07 DIAGNOSIS — R1310 Dysphagia, unspecified: Secondary | ICD-10-CM | POA: Insufficient documentation

## 2021-03-07 DIAGNOSIS — K219 Gastro-esophageal reflux disease without esophagitis: Secondary | ICD-10-CM | POA: Insufficient documentation

## 2021-03-07 LAB — BASIC METABOLIC PANEL
Anion gap: 5 (ref 5–15)
BUN: 19 mg/dL (ref 8–23)
CO2: 28 mmol/L (ref 22–32)
Calcium: 9.4 mg/dL (ref 8.9–10.3)
Chloride: 106 mmol/L (ref 98–111)
Creatinine, Ser: 0.73 mg/dL (ref 0.44–1.00)
GFR, Estimated: >60 mL/min (ref >60)
Glucose, Bld: 96 mg/dL (ref 70–99)
Potassium: 4.6 mmol/L (ref 3.5–5.1)
Sodium: 139 mmol/L (ref 135–145)

## 2021-03-11 ENCOUNTER — Ambulatory Visit (HOSPITAL_COMMUNITY): Payer: 59 | Admitting: Anesthesiology

## 2021-03-11 ENCOUNTER — Ambulatory Visit (HOSPITAL_COMMUNITY)
Admission: RE | Admit: 2021-03-11 | Discharge: 2021-03-11 | Disposition: A | Discharge status: Home / Self Care | Payer: 59 | Attending: Internal Medicine | Admitting: Internal Medicine

## 2021-03-11 ENCOUNTER — Encounter (HOSPITAL_COMMUNITY): Payer: Self-pay

## 2021-03-11 ENCOUNTER — Encounter (HOSPITAL_COMMUNITY)
Admission: RE | Disposition: A | Discharge status: Home / Self Care | Payer: Self-pay | Source: Home / Self Care | Attending: Internal Medicine

## 2021-03-11 ENCOUNTER — Other Ambulatory Visit: Payer: Self-pay

## 2021-03-11 DIAGNOSIS — K2970 Gastritis, unspecified, without bleeding: Secondary | ICD-10-CM | POA: Insufficient documentation

## 2021-03-11 DIAGNOSIS — K219 Gastro-esophageal reflux disease without esophagitis: Secondary | ICD-10-CM

## 2021-03-11 DIAGNOSIS — Z881 Allergy status to other antibiotic agents status: Secondary | ICD-10-CM | POA: Diagnosis not present

## 2021-03-11 DIAGNOSIS — Z885 Allergy status to narcotic agent status: Secondary | ICD-10-CM | POA: Diagnosis not present

## 2021-03-11 DIAGNOSIS — K5909 Other constipation: Secondary | ICD-10-CM | POA: Diagnosis not present

## 2021-03-11 DIAGNOSIS — K449 Diaphragmatic hernia without obstruction or gangrene: Secondary | ICD-10-CM | POA: Insufficient documentation

## 2021-03-11 DIAGNOSIS — K319 Disease of stomach and duodenum, unspecified: Secondary | ICD-10-CM | POA: Insufficient documentation

## 2021-03-11 DIAGNOSIS — K2100 Gastro-esophageal reflux disease with esophagitis, without bleeding: Secondary | ICD-10-CM

## 2021-03-11 DIAGNOSIS — R1310 Dysphagia, unspecified: Secondary | ICD-10-CM

## 2021-03-11 DIAGNOSIS — Z79890 Hormone replacement therapy (postmenopausal): Secondary | ICD-10-CM | POA: Diagnosis not present

## 2021-03-11 DIAGNOSIS — Z87891 Personal history of nicotine dependence: Secondary | ICD-10-CM | POA: Diagnosis not present

## 2021-03-11 DIAGNOSIS — K222 Esophageal obstruction: Secondary | ICD-10-CM | POA: Diagnosis not present

## 2021-03-11 DIAGNOSIS — Z800 Family history of malignant neoplasm of digestive organs: Secondary | ICD-10-CM | POA: Insufficient documentation

## 2021-03-11 DIAGNOSIS — Z79899 Other long term (current) drug therapy: Secondary | ICD-10-CM | POA: Insufficient documentation

## 2021-03-11 DIAGNOSIS — R1010 Upper abdominal pain, unspecified: Secondary | ICD-10-CM

## 2021-03-11 DIAGNOSIS — R1013 Epigastric pain: Secondary | ICD-10-CM | POA: Diagnosis present

## 2021-03-11 HISTORY — PX: ESOPHAGOGASTRODUODENOSCOPY (EGD) WITH PROPOFOL: SHX5813

## 2021-03-11 HISTORY — PX: BIOPSY: SHX5522

## 2021-03-11 HISTORY — PX: BALLOON DILATION: SHX5330

## 2021-03-11 SURGERY — ESOPHAGOGASTRODUODENOSCOPY (EGD) WITH PROPOFOL
Anesthesia: General

## 2021-03-11 MED ORDER — LACTATED RINGERS IV SOLN: INTRAVENOUS | Status: DC

## 2021-03-11 MED ORDER — PROPOFOL 10 MG/ML IV BOLUS
INTRAVENOUS | Status: DC | PRN
  Administered 2021-03-11: 40 mg via INTRAVENOUS
  Administered 2021-03-11: 100 mg via INTRAVENOUS

## 2021-03-11 MED ORDER — LIDOCAINE HCL (CARDIAC) PF 100 MG/5ML IV SOSY
PREFILLED_SYRINGE | INTRAVENOUS | Status: DC | PRN
  Administered 2021-03-11: 50 mg via INTRAVENOUS

## 2021-03-11 MED ORDER — PANTOPRAZOLE SODIUM 40 MG PO TBEC: 40.0000 mg | DELAYED_RELEASE_TABLET | Freq: Two times a day (BID) | ORAL | 5 refills | Status: DC

## 2021-03-11 NOTE — Anesthesia Preprocedure Evaluation (Addendum)
Anesthesia Evaluation  Patient identified by MRN, date of birth, ID band Patient awake    Reviewed: Allergy & Precautions, NPO status , Patient's Chart, lab work & pertinent test results  Airway Mallampati: II  TM Distance: >3 FB Neck ROM: Full    Dental  (+) Dental Advisory Given,  Bridge :   Pulmonary former smoker,    Pulmonary exam normal breath sounds clear to auscultation       Cardiovascular Exercise Tolerance: Good Normal cardiovascular exam Rhythm:Regular Rate:Normal     Neuro/Psych  Neuromuscular disease (fibromyalgia ) negative psych ROS   GI/Hepatic Neg liver ROS, GERD  Poorly Controlled,  Endo/Other  Hypothyroidism   Renal/GU negative Renal ROS     Musculoskeletal  (+) Fibromyalgia -  Abdominal   Peds  Hematology negative hematology ROS (+)   Anesthesia Other Findings   Reproductive/Obstetrics negative OB ROS                            Anesthesia Physical  Anesthesia Plan  ASA: 2  Anesthesia Plan: General   Post-op Pain Management:    Induction: Intravenous  PONV Risk Score and Plan: TIVA  Airway Management Planned: Nasal Cannula and Natural Airway  Additional Equipment:   Intra-op Plan:   Post-operative Plan:   Informed Consent: I have reviewed the patients History and Physical, chart, labs and discussed the procedure including the risks, benefits and alternatives for the proposed anesthesia with the patient or authorized representative who has indicated his/her understanding and acceptance.     Dental advisory given  Plan Discussed with: CRNA and Surgeon  Anesthesia Plan Comments:         Anesthesia Quick Evaluation

## 2021-03-11 NOTE — Interval H&P Note (Signed)
History and Physical Interval Note:  03/11/2021 10:11 AM  Carolyn Welch  has presented today for surgery, with the diagnosis of gerd. dysphagia, upper abd pain, RUQ tenderness.  The various methods of treatment have been discussed with the patient and family. After consideration of risks, benefits and other options for treatment, the patient has consented to  Procedure(s) with comments: ESOPHAGOGASTRODUODENOSCOPY (EGD) WITH PROPOFOL (N/A) - 11:00am BALLOON DILATION (N/A) as a surgical intervention.  The patient's history has been reviewed, patient examined, no change in status, stable for surgery.  I have reviewed the patient's chart and labs.  Questions were answered to the patient's satisfaction.     Lanelle Bal

## 2021-03-11 NOTE — Discharge Instructions (Addendum)
EGD Discharge instructions Please read the instructions outlined below and refer to this sheet in the next few weeks. These discharge instructions provide you with general information on caring for yourself after you leave the hospital. Your doctor may also give you specific instructions. While your treatment has been planned according to the most current medical practices available, unavoidable complications occasionally occur. If you have any problems or questions after discharge, please call your doctor. ACTIVITY You may resume your regular activity but move at a slower pace for the next 24 hours.  Take frequent rest periods for the next 24 hours.  Walking will help expel (get rid of) the air and reduce the bloated feeling in your abdomen.  No driving for 24 hours (because of the anesthesia (medicine) used during the test).  You may shower.  Do not sign any important legal documents or operate any machinery for 24 hours (because of the anesthesia used during the test).  NUTRITION Drink plenty of fluids.  You may resume your normal diet.  Begin with a light meal and progress to your normal diet.  Avoid alcoholic beverages for 24 hours or as instructed by your caregiver.  MEDICATIONS You may resume your normal medications unless your caregiver tells you otherwise.  WHAT YOU CAN EXPECT TODAY You may experience abdominal discomfort such as a feeling of fullness or "gas" pains.  FOLLOW-UP Your doctor will discuss the results of your test with you.  SEEK IMMEDIATE MEDICAL ATTENTION IF ANY OF THE FOLLOWING OCCUR: Excessive nausea (feeling sick to your stomach) and/or vomiting.  Severe abdominal pain and distention (swelling).  Trouble swallowing.  Temperature over 101 F (37.8 C).  Rectal bleeding or vomiting of blood.    You have a significant amount of inflammation in your esophagus and stomach.  Took biopsies of your stomach to rule out infection with a bacteria called H. pylori.  You  have a small hiatal hernia.  You also have a tight esophageal stricture which I stretched today.  Hopefully this helps with your swallowing.  I am going to increase your pantoprazole to 40 mg twice daily.  Await pathology results, office will contact you.  We can consider repeat EGD for repeat dilation as well as evaluate response to therapy.  Follow-up with GI in 3-4 months  I hope you have a great rest of your week!  Hennie Duos. Marletta Lor, D.O. Gastroenterology and Hepatology [facility]

## 2021-03-11 NOTE — Transfer of Care (Signed)
Immediate Anesthesia Transfer of Care Note  Patient: Carolyn Welch  Procedure(s) Performed: ESOPHAGOGASTRODUODENOSCOPY (EGD) WITH PROPOFOL BALLOON DILATION  Patient Location: Endoscopy Unit  Anesthesia Type:General  Level of Consciousness: awake  Airway & Oxygen Therapy: Patient Spontanous Breathing  Post-op Assessment: Report given to RN and Post -op Vital signs reviewed and stable  Post vital signs: Reviewed and stable  Last Vitals:  Vitals Value Taken Time  BP    Temp    Pulse    Resp    SpO2      Last Pain:  Vitals:   03/11/21 1023  TempSrc:   PainSc: 7       Patients Stated Pain Goal: 8 (03/11/21 0948)  Complications: No notable events documented.

## 2021-03-11 NOTE — Anesthesia Postprocedure Evaluation (Signed)
Anesthesia Post Note  Patient: Carolyn Welch  Procedure(s) Performed: ESOPHAGOGASTRODUODENOSCOPY (EGD) WITH PROPOFOL BALLOON DILATION BIOPSY  Patient location during evaluation: Endoscopy Anesthesia Type: General Level of consciousness: awake and alert and oriented Pain management: pain level controlled Vital Signs Assessment: post-procedure vital signs reviewed and stable Respiratory status: spontaneous breathing, nonlabored ventilation and respiratory function stable Cardiovascular status: blood pressure returned to baseline and stable Postop Assessment: no apparent nausea or vomiting Anesthetic complications: no   No notable events documented.   Last Vitals:  Vitals:   03/11/21 0948 03/11/21 1034  BP: 127/77 (!) 96/39  Pulse: 61   Resp: 15 18  Temp: 36.7 C 36.6 C  SpO2: 98% 96%    Last Pain:  Vitals:   03/11/21 1034  TempSrc: Oral  PainSc: 0-No pain                 Ma Munoz C Shalom Ware

## 2021-03-11 NOTE — Op Note (Addendum)
[facility] Patient Name: Carolyn Welch Procedure Date: 03/11/2021 10:07 AM MRN: [id number] Date of Birth: 05-Nov-1955 Attending MD: Elon Alas. Abbey Chatters DO CSN: [id number] Age: 65 Admit Type: Outpatient Procedure:                Upper GI endoscopy Indications:              Epigastric abdominal pain, Dysphagia Providers:                Elon Alas. Abbey Chatters, DO, Lambert Mody, Nelma Rothman, Technician Referring MD:              Medicines:                See the Anesthesia note for documentation of the                            administered medications Complications:            No immediate complications. Estimated Blood Loss:     Estimated blood loss was minimal. Procedure:                Pre-Anesthesia Assessment:                           - The anesthesia plan was to use monitored                            anesthesia care (MAC).                           After obtaining informed consent, the endoscope was                            passed under direct vision. Throughout the                            procedure, the patient's blood pressure, pulse, and                            oxygen saturations were monitored continuously. The                            [id number]-H190 ([id number]) scope was introduced through the                            mouth, and advanced to the second part of duodenum.                            The upper GI endoscopy was accomplished without                            difficulty. The patient tolerated the procedure                            well. Scope In: 10:25:45 AM  Scope Out: 10:30:50 AM Total Procedure Duration: 0 hours 5 minutes 5 seconds  Findings:      A small hiatal hernia was present.      LA Grade C (one or more mucosal breaks continuous between tops of 2 or       more mucosal folds, less than 75% circumference) esophagitis with no       bleeding was found at the gastroesophageal junction.      One benign-appearing,  intrinsic moderate stenosis was found in the       distal esophagus. The stenosis was traversed. A TTS dilator was passed       through the scope. Dilation with an 18-19-20 mm balloon dilator was       performed to 18 mm. The dilation site was examined and showed mild       mucosal disruption and moderate improvement in luminal narrowing.      Patchy mild inflammation characterized by erythema was found in the       entire examined stomach. Biopsies were taken with a cold forceps for       Helicobacter pylori testing.      The duodenal bulb, first portion of the duodenum and second portion of       the duodenum were normal. Impression:               - Small hiatal hernia.                           - LA Grade C reflux esophagitis with no bleeding.                           - Benign-appearing esophageal stenosis. Dilated.                           - Gastritis. Biopsied.                           - Normal duodenal bulb, first portion of the                            duodenum and second portion of the duodenum. Moderate Sedation:      Per Anesthesia Care Recommendation:           - Patient has a contact number available for                            emergencies. The signs and symptoms of potential                            delayed complications were discussed with the                            patient. Return to normal activities tomorrow.                            Written discharge instructions were provided to the                            patient.                           -  Resume previous diet.                           - Continue present medications.                           - Await pathology results.                           - Use Protonix (pantoprazole) 40 mg PO BID.                           - No ibuprofen, naproxen, or other non-steroidal                            anti-inflammatory drugs.                           - Consider repeat upper endoscopy in 12 weeks to                             evaluate the response to therapy, potential repeat                            dilation, and biopsy for Barretts. Procedure Code(s):        --- Professional ---                           [phone], Esophagogastroduodenoscopy, flexible,                            transoral; with transendoscopic balloon dilation of                            esophagus (less than 30 mm diameter)                           43239, 59, Esophagogastroduodenoscopy, flexible,                            transoral; with biopsy, single or multiple Diagnosis Code(s):        --- Professional ---                           K44.9, Diaphragmatic hernia without obstruction or                            gangrene                           K21.00, Gastro-esophageal reflux disease with                            esophagitis, without bleeding                           K22.2, Esophageal obstruction  K29.70, Gastritis, unspecified, without bleeding                           R10.13, Epigastric pain                           R13.10, Dysphagia, unspecified CPT copyright 2019 American Medical Association. All rights reserved. The codes documented in this report are preliminary and upon coder review may  be revised to meet current compliance requirements. Elon Alas. Abbey Chatters, DO [place] Abbey Chatters, DO 03/11/2021 10:35:38 AM This report has been signed electronically. Number of Addenda: 0

## 2021-03-12 LAB — SURGICAL PATHOLOGY

## 2021-03-13 ENCOUNTER — Encounter (HOSPITAL_COMMUNITY): Payer: Self-pay | Admitting: Internal Medicine

## 2021-03-17 ENCOUNTER — Encounter: Payer: Self-pay | Admitting: "Endocrinology

## 2021-03-17 ENCOUNTER — Other Ambulatory Visit: Payer: Self-pay

## 2021-03-17 ENCOUNTER — Ambulatory Visit: Payer: 59 | Admitting: "Endocrinology

## 2021-03-17 VITALS — BP 122/78 | HR 76 | Ht 61.0 in | Wt 158.4 lb

## 2021-03-17 DIAGNOSIS — E039 Hypothyroidism, unspecified: Secondary | ICD-10-CM | POA: Diagnosis not present

## 2021-03-17 NOTE — Progress Notes (Signed)
Endocrinology Consult Note                                            03/17/2021, 5:35 PM   Subjective:    Patient ID: Carolyn Welch, female    DOB: Sep 16, 1955, PCP Alvina Filbert, MD   Past Medical History:  Diagnosis Date   Fibromyalgia    GERD (gastroesophageal reflux disease)    Hayfever    Hyperlipidemia    Hypothyroidism    Past Surgical History:  Procedure Laterality Date   BALLOON DILATION N/A 03/11/2021   Procedure: BALLOON DILATION;  Surgeon: Lanelle Bal, DO;  Location: [facility];  Service: Endoscopy;  Laterality: N/A;   BIOPSY  03/11/2021   Procedure: BIOPSY;  Surgeon: Lanelle Bal, DO;  Location: [facility];  Service: Endoscopy;;   CARPAL TUNNEL RELEASE Bilateral    COLONOSCOPY WITH PROPOFOL N/A 09/09/2020   Surgeon: Lanelle Bal, DO;  Nonbleeding internal hemorrhoids, diverticulosis in the sigmoid and descending colon.  Repeat in 10 years.   ESOPHAGOGASTRODUODENOSCOPY (EGD) WITH PROPOFOL N/A 03/11/2021   Procedure: ESOPHAGOGASTRODUODENOSCOPY (EGD) WITH PROPOFOL;  Surgeon: Lanelle Bal, DO;  Location: [facility];  Service: Endoscopy;  Laterality: N/A;  11:00am   Social History   Socioeconomic History   Marital status: Married    Spouse name: Not on file   Number of children: Not on file   Years of education: Not on file   Highest education level: Not on file  Occupational History   Not on file  Tobacco Use   Smoking status: Former   Smokeless tobacco: Never  Vaping Use   Vaping Use: Never used  Substance and Sexual Activity   Alcohol use: Yes    Comment: socially   Drug use: Never   Sexual activity: Not on file  Other Topics Concern   Not on file  Social History Narrative   Not on file   Social Determinants of Health   Financial Resource Strain: Not on file  Food Insecurity: Not on file  Transportation Needs: Not on file  Physical Activity: Not on file  Stress: Not on file  Social Connections: Not on  file   Family History  Problem Relation Age of Onset   Cancer Mother    Hypertension Mother    Multiple sclerosis Father    Esophageal cancer Maternal Aunt    Colon cancer Paternal Grandmother        diagnosed in her 42s   Outpatient Encounter Medications as of 03/17/2021  Medication Sig   acetaminophen (TYLENOL 8 HOUR ARTHRITIS PAIN) 650 MG CR tablet Take 650 mg by mouth every 8 (eight) hours as needed for pain.   clonazePAM (KLONOPIN) 0.5 MG tablet Take 0.5 mg by mouth at bedtime. As needed   cyclobenzaprine (FLEXERIL) 10 MG tablet Take 10 mg by mouth 2 (two) times daily as needed.   loratadine (CLARITIN) 10 MG tablet Take 10 mg by mouth daily.   [DISCONTINUED] venlafaxine XR (EFFEXOR-XR) 150 MG 24 hr capsule Take 150 mg by mouth daily with breakfast.   Alum Hydroxide-Mag Carbonate (GAVISCON PO) Take 1 tablet by mouth daily as needed (reflux).   Calcium Carbonate Antacid (TUMS PO) Take 1,500 mg by mouth daily as needed (reflux). 750 each   Calcium Citrate-Vitamin D (CITRACAL + D PO) Take 1,200 mg by mouth  daily.   DULoxetine (CYMBALTA) 60 MG capsule Take 60 mg by mouth daily.   EPINEPHrine 0.3 mg/0.3 mL IJ SOAJ injection Inject 0.3 mg into the muscle as needed.   levothyroxine (SYNTHROID) 88 MCG tablet Take 88 mcg by mouth daily.   MAGNESIUM OXIDE PO Take 450 mg by mouth daily.   Multiple Vitamin (MULTIVITAMIN) tablet Take 1 tablet by mouth daily.   Omega-3 Fatty Acids (FISH OIL PO) Take 800 mg by mouth daily.   pantoprazole (PROTONIX) 40 MG tablet Take 1 tablet (40 mg total) by mouth 2 (two) times daily before a meal.   spironolactone (ALDACTONE) 100 MG tablet Take 100 mg by mouth 2 (two) times daily.   traZODone (DESYREL) 50 MG tablet Take 100 mg by mouth at bedtime.   VALTREX 500 MG tablet Take 500 mg by mouth 2 (two) times daily.   [DISCONTINUED] ibuprofen (ADVIL) 200 MG tablet Take 200 mg by mouth every 6 (six) hours as needed (Back pain/fibromialgia). (Patient not taking:  Reported on 03/17/2021)   [DISCONTINUED] liothyronine (CYTOMEL) 25 MCG tablet Take 25 mcg by mouth daily. (Patient not taking: Reported on 03/17/2021)   [DISCONTINUED] Romosozumab-aqqg (EVENITY) 105 MG/1. SOSY injection Inject 210 mg into the skin every 30 (thirty) days. (Patient not taking: Reported on 03/17/2021)   No facility-administered encounter medications on file as of 03/17/2021.   ALLERGIES: Allergies  Allergen Reactions   Alpha-Gal Anaphylaxis and Swelling   Cephalosporins Hives   Clindamycin Hives   Codeine     Other reaction(s): ITCHY   Doxycycline Hives   Sulfa Antibiotics     VACCINATION STATUS:  There is no immunization history on file for this patient.  HPI Carolyn Welch is 65 y.o. female who presents today with a medical history as above. she is being seen in consultation for hypothyroidism requested by Alvina Filbert, MD.   History is obtained directly from the patient as well as chart review.  She reports that she was diagnosed with hypothyroidism at approximate age of 31 years.  She has taken various doses in forms of thyroid hormone over the years.  She is currently on levothyroxine 88 mcg p.o. daily.  She describes appropriate intake of this medicine. She does not have recent thyroid function tests.  She describes several symptoms including  anxiety, difficulty focusing, depression, progressive weight gain, constant reflux, and hair loss. -She believes she is not getting enough thyroid hormone.  Notes accompanying her referral package shows suppressed TSH of 0.03 in June 2022.  Simultaneous free T4 was 1.4 as well as free T3 3.0.  The circumstance of her diagnosis was not available to review.  She denies dysphagia, shortness of breath, nor voice change.  Review of Systems  Constitutional: + Fluctuating body weight, gain recently, + fatigue, no subjective hyperthermia, no subjective hypothermia Eyes: no blurry vision, no xerophthalmia ENT: no sore throat,  no nodules palpated in throat, no dysphagia/odynophagia, no hoarseness Cardiovascular: no Chest Pain, no Shortness of Breath, no palpitations, no leg swelling Respiratory: no cough, no shortness of breath Gastrointestinal: no Nausea/Vomiting/Diarhhea Musculoskeletal:+muscle/joint aches Skin: no rashes Neurological: +tremors, no numbness, no tingling, no dizziness Psychiatric: + depression, + anxiety  Objective:    Vitals with BMI 03/17/2021 02/17/2021 09/09/2020  Height 5\' 1"  5\' 1"  -  Weight 158 lbs 6 oz 158 lbs 3 oz -  BMI 29.94 29.91 -  Systolic 122 124 91  Diastolic 78 74 40  Pulse 76 74 79  Some encounter information is confidential and restricted.  Go to Review Flowsheets activity to see all data.    BP 122/78   Pulse 76   Ht 5\' 1"  (1.549 m)   Wt 158 lb 6.4 oz (71.8 kg)   BMI 29.93 kg/m   Wt Readings from Last 3 Encounters:  03/17/21 158 lb 6.4 oz (71.8 kg)  02/17/21 158 lb 3.2 oz (71.8 kg)  09/09/20 142 lb (64.4 kg)    Physical Exam  Constitutional:  Body mass index is 29.93 kg/m.,  not in acute distress, + anxious state of mind.  Eyes: PERRLA, EOMI, no exophthalmos ENT: moist mucous membranes, no gross thyromegaly, + palpable thyroid  Cardiovascular: normal precordial activity, Regular Rate and Rhythm, no Murmur/Rubs/Gallops Respiratory:  adequate breathing efforts, no gross chest deformity, Clear to auscultation bilaterally  Musculoskeletal: no gross deformities, strength intact in all four extremities Skin: moist, warm, no rashes Neurological: +tremor with outstretched hands, Deep tendon reflexes are pronounced on bilateral lower extremities    CMP ( most recent) CMP     Component Value Date/Time   NA 139 03/07/2021 0944   K 4.6 03/07/2021 0944   CL 106 03/07/2021 0944   CO2 28 03/07/2021 0944   GLUCOSE 96 03/07/2021 0944   BUN 19 03/07/2021 0944   CREATININE 0.73 03/07/2021 0944   CREATININE 0.92 02/17/2021 1156   CALCIUM 9.4 03/07/2021 0944   PROT  6.7 02/17/2021 1156   AST 26 02/17/2021 1156   ALT 23 02/17/2021 1156   BILITOT 0.5 02/17/2021 1156   GFRNONAA >60 03/07/2021 0944        Assessment & Plan:   1. Hypothyroidism, unspecified type  - Carolyn Welch  is being seen at a kind request of Carolyn Sox, MD. - I have reviewed her available thyroid records and clinically evaluated the patient. - Based on these reviews, she has historical report of hypothyroidism,  however,  there is not sufficient information to proceed with definitive treatment plan.  - she will need a repeat,  more complete thyroid function test towards confirming the diagnosis.  She will be sent to lab for TSH, free T4/free T3, prolactin, TPO and thyroglobulin antibodies. -Given her reported history of Hashimoto's thyroiditis, she will be offered a baseline thyroid/neck ultrasound. In the meantime, she is advised to continue her current dose of levothyroxine 88 mcg p.o. daily before breakfast.   - We discussed about the correct intake of her thyroid hormone, on empty stomach at fasting, with water, separated by at least 30 minutes from breakfast and other medications,  and separated by more than 4 hours from calcium, iron, multivitamins, acid reflux medications (PPIs). -Patient is made aware of the fact that thyroid hormone replacement is needed for life, dose to be adjusted by periodic monitoring of thyroid function tests.   - I did not initiate any new prescriptions today. - she is advised to maintain close follow up with Alvina Filbert, MD for primary care needs.   - Time spent with the patient: 60 minutes, of which >50% was spent in  counseling her about her hypothyroidism and the rest in obtaining information about her symptoms, reviewing her previous labs/studies ( including abstractions from other facilities),  evaluations, and treatments,  and developing a plan to confirm diagnosis and long term treatment based on the latest standards of  care/guidelines; and documenting her care.  Alvina Filbert participated in the discussions, expressed understanding, and voiced agreement with the above plans.  All questions were answered to her satisfaction. she is encouraged to contact  clinic should she have any questions or concerns prior to her return visit.  Follow up plan: Return in about 1 week (around 03/24/2021) for Labs Today- Non-Fasting Ok.   Marquis Lunch, MD [facility] [address] [place], [place] [address] Phone: [phone]  Fax: [phone]     03/17/2021, 5:35 PM  This note was partially dictated with voice recognition software. Similar sounding words can be transcribed inadequately or may not  be corrected upon review.

## 2021-03-18 LAB — THYROGLOBULIN ANTIBODY: Thyroglobulin Antibody: 10.4 IU/mL — ABNORMAL HIGH (ref 0.0–0.9)

## 2021-03-18 LAB — T3, FREE: T3, Free: 2.1 pg/mL (ref 2.0–4.4)

## 2021-03-18 LAB — T4, FREE: Free T4: 1.34 ng/dL (ref 0.82–1.77)

## 2021-03-18 LAB — THYROID PEROXIDASE ANTIBODY: Thyroperoxidase Ab SerPl-aCnc: <8 IU/mL (ref 0–34)

## 2021-03-18 LAB — TSH: TSH: 0.153 u[IU]/mL — ABNORMAL LOW (ref 0.450–4.500)

## 2021-03-18 LAB — PROLACTIN: Prolactin: 17.6 ng/mL (ref 4.8–23.3)

## 2021-03-25 ENCOUNTER — Ambulatory Visit: Payer: 59 | Admitting: "Endocrinology

## 2021-04-01 ENCOUNTER — Ambulatory Visit (HOSPITAL_COMMUNITY): Admission: RE | Admit: 2021-04-01 | Payer: 59 | Source: Ambulatory Visit

## 2021-05-23 NOTE — Addendum Note (Signed)
Addended by: Cassandria Anger on: 05/23/2021 11:48 AM   Modules accepted: Level of Service

## 2021-06-12 ENCOUNTER — Encounter: Payer: Self-pay | Admitting: Internal Medicine

## 2021-07-23 ENCOUNTER — Telehealth: Payer: Self-pay | Admitting: Internal Medicine

## 2021-07-23 NOTE — Telephone Encounter (Signed)
She needs to be seen in the office for evaluation. I have openings tomorrow, can we see if she can come in?  ?

## 2021-07-23 NOTE — Telephone Encounter (Signed)
Pt called, she wants Dr Queen Blossom nurse to call her back. She said she needed to be seen and is having issues she needed to let the nurse know about. She is already scheduled 2 appointments. 4/6 at 10 with Dr Marletta Lor and 6/2 at 1030 with Ermalinda Memos, PA.  She is on the wait list as well. [phone] ?

## 2021-07-23 NOTE — Telephone Encounter (Signed)
Noted  

## 2021-07-23 NOTE — Progress Notes (Addendum)
Referring Provider: Alvina FilbertHunter, Denise, MD Primary Care Physician:  Alvina FilbertHunter, Denise, MD Primary GI Physician: Dr. Marletta Lorarver  Chief Complaint  Patient presents with   Abdominal Pain    Acid reflux, burning when swallowing, swollen stomach, right upper pain     HPI:   Carolyn Welch is a 66 y.o. female presenting today with chief complaint of GERD, dysphagia, abdominal bloating, and constipation.   Last seen in our office 02/17/2021 for GERD, upper abdominal pain, constipation, and dysphagia.  GERD had been uncontrolled with Tums and Gaviscon, recently resumed omeprazole 20 mg daily without improvement.  This she had previously taken omeprazole in February/March with significant improvement at that time.  Associated solid food and pill dysphagia.  Also with 6 months of upper abdominal pain described as burning, gnawing, improved somewhat by eating.  Please has been taking 2 ibuprofen 3 times daily for back pain and fibromyalgia.  She had decreased this recently after being started on Cymbalta which was helping with her chronic pain.  No BRBPR or melena.  On exam, she has mild TTP in epigastric area and mild to moderate TTP in RUQ region.  Also with chronic constipation using MOM or suppositories.  Recommended updating labs, RUQ ultrasound, EGD, switch omeprazole to pantoprazole 40 mg daily, limit NSAIDs, start MiraLAX daily, and follow-up after procedure.  CBC, CMP, lipase with no significant abnormalities.  RUQ ultrasound 03/07/2021 with no evidence of gallstones, CBD normal, liver normal.  EGD 03/11/2021 with small hiatal hernia, grade C reflux esophagitis, benign-appearing esophageal stenosis dilated, gastritis biopsied.  Recommended PPI twice daily.  No NSAIDs.  Pathology with reactive gastropathy with intestinal metaplasia, negative for H. pylori.  Recommended considering repeat EGD in 12 weeks to evaluate response to therapy, potential repeat dilation, and biopsy for Barrett's.  Patient called  3/8 reporting abdominal swelling, bloating, abdominal pain.  Recommended office visit for further evaluation.  Today: Reports she is having reflux symptoms daily, burning in her stomach and esophagus.  She is only taking Protonix at bedtime.  States when she takes Protonix during the day, she feels it makes her vision blurry.  Continues to have some trouble with swallowing.  States it did improve after her dilation, but did not completely resolve; however, symptoms have been worse lately.  Having trouble swallowing pills at times and feels foods occasionally get hung in her upper throat/esophagus area.  She feels this is secondary to swelling in her face and neck for the last couple of months.  States her tongue is always swollen.  She is also had some hoarseness for the last couple of days and states she intermittently gets sores on her tongue when it is swollen.  She does have alpha gal and avoids beef and pork.  She had previously been avoiding dairy or only consuming very minimal amounts of dairy, but has been consuming dairy on a daily basis without restriction including yogurt every morning, occasional milk, cheese on salads, cheese pizza with extra cheese..  She does have an EpiPen to use if needed.  She does see an allergist, but states she has not seen them in a year.  Feels her abdomen is swollen. Can get so uncomfortable she has to take her bra off. Worsens after eating, but cannot identify any particular food triggers. Started over the last few weeks. No swelling in her legs. At one point she was gassy, but not now. Bowels move a couple times a week. Taking magnesium supplement and occasionally a suppository.  Tried MiraLAX for a week and nothing happened.   Ports RUQ soreness to palpation. No pain without palpation.  No nausea or vomiting.  She had stopped using ibuprofen for a while while she was on Cymbalta, but she recently stopped Cymbalta as she felt this may be contributing to reflux  symptoms and constipation and has now returned to using ibuprofen as needed.  No alcohol recently.   Past Medical History:  Diagnosis Date   Allergy to alpha-gal    Constipation    Fibromyalgia    GERD (gastroesophageal reflux disease)    Hayfever    Hyperlipidemia    Hypothyroidism     Past Surgical History:  Procedure Laterality Date   BALLOON DILATION N/A 03/11/2021   Procedure: BALLOON DILATION;  Surgeon: Lanelle Bal, DO;  [facility];  Service: Endoscopy;  Laterality: N/A;   BIOPSY  03/11/2021   Procedure: BIOPSY;  Surgeon: Lanelle Bal, DO;  [facility];  Service: Endoscopy;;   CARPAL TUNNEL RELEASE Bilateral    COLONOSCOPY WITH PROPOFOL N/A 09/09/2020   Surgeon: Lanelle Bal, DO;  Nonbleeding internal hemorrhoids, diverticulosis in the sigmoid and descending colon.  Repeat in 10 years.   ESOPHAGOGASTRODUODENOSCOPY (EGD) WITH PROPOFOL N/A 03/11/2021   Surgeon: Lanelle Bal, DO; small hiatal hernia, grade C reflux esophagitis, benign-appearing esophageal stenosis dilated, gastritis biopsied. Pathology with reactive gastropathy with intestinal metaplasia, negative for H. pylori.    Current Outpatient Medications  Medication Sig Dispense Refill   acetaminophen (TYLENOL) 650 MG CR tablet Take 650 mg by mouth every 8 (eight) hours as needed for pain.     Alum Hydroxide-Mag Carbonate (GAVISCON PO) Take 1 tablet by mouth daily as needed (reflux).     Calcium Carbonate Antacid (TUMS PO) Take 1,500 mg by mouth daily as needed (reflux). 750 each     Calcium Citrate-Vitamin D (CITRACAL + D PO) Take 1,200 mg by mouth daily.     cyclobenzaprine (FLEXERIL) 10 MG tablet Take 10 mg by mouth 2 (two) times daily as needed.     dexlansoprazole (DEXILANT) 60 MG capsule Take 1 capsule (60 mg total) by mouth daily. 30 capsule 3   EPINEPHrine 0.3 mg/0.3 mL IJ SOAJ injection Inject 0.3 mg into the muscle as needed.     levothyroxine (SYNTHROID) 88 MCG  tablet Take 88 mcg by mouth daily.     loratadine (CLARITIN) 10 MG tablet Take 10 mg by mouth daily.     MAGNESIUM OXIDE PO Take 450 mg by mouth daily.     Multiple Vitamin (MULTIVITAMIN) tablet Take 1 tablet by mouth daily.     spironolactone (ALDACTONE) 100 MG tablet Take 100 mg by mouth 2 (two) times daily.     traZODone (DESYREL) 50 MG tablet Take 100 mg by mouth at bedtime.     VALTREX 500 MG tablet Take 500 mg by mouth 2 (two) times daily.     No current facility-administered medications for this visit.    Allergies as of 07/24/2021 - Review Complete 07/24/2021  Allergen Reaction Noted   Alpha-gal Anaphylaxis and Swelling 03/05/2021   Cephalosporins Hives 08/27/2020   Clindamycin Hives 12/06/2018   Codeine  02/01/2013   Doxycycline Hives 02/01/2013   Sulfa antibiotics  03/17/2021    Family History  Problem Relation Age of Onset   Cancer Mother    Hypertension Mother    Multiple sclerosis Father    Esophageal cancer Maternal Aunt    Colon cancer Paternal Grandmother  diagnosed in her 68s    Social History   Socioeconomic History   Marital status: Married    Spouse name: Not on file   Number of children: Not on file   Years of education: Not on file   Highest education level: Not on file  Occupational History   Not on file  Tobacco Use   Smoking status: Former   Smokeless tobacco: Never  Vaping Use   Vaping Use: Never used  Substance and Sexual Activity   Alcohol use: Yes    Comment: socially   Drug use: Never   Sexual activity: Not on file  Other Topics Concern   Not on file  Social History Narrative   Not on file   Social Determinants of Health   Financial Resource Strain: Not on file  Food Insecurity: Not on file  Transportation Needs: Not on file  Physical Activity: Not on file  Stress: Not on file  Social Connections: Not on file    Review of Systems: Gen: Denies fever, chills, cold or flulike symptoms, presyncope, syncope. CV: Denies  chest pain, palpitations.. Resp: Denies dyspnea or cough. GI: See HPI Heme: See HPI  Physical Exam: BP 128/72    Pulse 84    Temp (!) 97.4 F (36.3 C) (Temporal)    Ht  (1.549 m)    Wt 157 lb 3.2 oz (71.3 kg)    BMI 29.70 kg/m  General:   Alert and oriented. No distress noted. Pleasant and cooperative.  Head:  Normocephalic and atraumatic. Eyes:  Conjuctiva clear without scleral icterus. Face/Neck: No appreciable swelling though I have not evaluate patient without mask in the past, so I do not have a baseline to compare to.  Mouth: No appreciable tongue swelling though no baseline to compare to.  Heart:  S1, S2 present without murmurs appreciated. Lungs:  Clear to auscultation bilaterally. No wheezes, rales, or rhonchi. No distress.  Abdomen:  +BS, soft, minimal tenderness to deep palpation in RUQ. Abdomen is obese, but doesn't appear distended. No rebound or guarding. No HSM or masses noted. Msk:  Symmetrical without gross deformities. Normal posture. Extremities:  Without edema. Neurologic:  Alert and  oriented x4 Psych: Normal mood and affect.   Assessment: 66 year old female with history significant for GERD with reflux esophagitis, dysphagia with esophageal stenosis noted on EGD in October 2022, gastritis, chronic constipation, alpha gal, presenting today with chief complaint of GERD, dysphagia, abdominal bloating, and constipation.  GERD: EGD October 2022 with grade C reflux esophagitis, esophageal stenosis dilated, and gastritis with biopsies negative for H. pylori.  Dr. Marletta Lor recommended considering repeat EGD in 3 months for repeat dilation and biopsy for Barrett's. We had recommended PPI BID previously, but currently, symptoms are uncontrolled on Protonix 40 mg nightly as she feels taking Protonix during the day causes blurry vision.  Previously failed omeprazole.  Symptoms may be influenced by dietary indiscretion in the setting of alpha gal as well as NSAID use.  We will  try changing Protonix to Dexilant 60 mg daily.  Reinforced GERD diet/lifestyle and importance of avoiding all mammalian derived products.   Dysphagia: In the setting of esophageal stenosis s/p dilation in October 2022 with symptom improvement, but not complete resolution.  Symptoms are now worsening though this is in the setting of facial, tongue, and neck swelling x 2 months which may be secondary to alpha gal in the setting of dietary indiscretion. Uncontrolled GERD also likely contributing.  Cannot rule out underlying esophageal dysmotility.  Dr. Marletta Lor had recommended considering repeat EGD in 3 months for potential repeat dilation and biopsy to evaluate for Barrett's; however, with current facial and neck swelling, I will hold off on scheduling this for now and discuss timing with Dr. Marletta Lor.   Abdominal bloating:  Patient reports postprandial bloating for the last 2 weeks. Suspect this is likely secondary to alpha gal in the setting of dietary indiscretion, previously avoiding/consuming minimal dairy, but more recently had been consuming dairy on a daily basis. Recent EGD with normal appearing small bowel. No significant pain, just uncomfortable related to postprandial bloating.  No associated nausea or vomiting.  She does have significant constipation which may also be contributing. Recommended strict dairy free diet and continuing to avoid all other mammalian during.  We will also try her on Linzess 145 mcg for constipation.  Constipation: Chronic.  Uncontrolled using magnesium supplement and suppositories as needed.  Failed MiraLAX previously.  We will try Linzess 145 mcg daily.    Plan:  Stop Protonix and start Dexilant 60 mg daily.  Prescription sent to pharmacy. Reinforced GERD diet/lifestyle.  Separate written instructions provided. Avoid NSAIDs. Recommended patient discussing medication options for fibromyalgia with her pain management provider. Will discuss timing of EGD +/- dilation +  esophageal biopsies with Dr. Marletta Lor.  Start Linzess 145 mcg daily.  Samples provided.  Requested progress report in 1-2 weeks. Strict avoidance of all mammalian derived products including dairy. Recommended patient reach out to her allergist to let them know of her current symptoms and arrange follow-up. Recommended ER evaluation/activation of 911 and use of EpiPen if any worsening swelling, sensation of throat closing, shortness of breath. Follow-up in 2-3 months.    Ermalinda Memos, PA-C [facility] 07/24/2021   Addendum:  Discussed with Dr. Marletta Lor who agrees with delaying EGD for now. Can reassess at next visit.

## 2021-07-23 NOTE — Telephone Encounter (Addendum)
Spoke to pt, she informed me that her stomach is swollen and bloated. States she looks like she is pregnant and stomach hurts really bad. She states she is still having upper right side pain that won't go away. Is upset because she was suppose to come back 3 months after procedure, per Dr. Marletta Lor, but can't get in. States someone needs to do something for her.  ?

## 2021-07-23 NOTE — Telephone Encounter (Signed)
Pt is aware of OV for in the morning at 1030am and April OV was cancelled ?

## 2021-07-24 ENCOUNTER — Ambulatory Visit: Payer: 59 | Admitting: Gastroenterology

## 2021-07-24 ENCOUNTER — Encounter: Payer: Self-pay | Admitting: Gastroenterology

## 2021-07-24 ENCOUNTER — Other Ambulatory Visit: Payer: Self-pay

## 2021-07-24 VITALS — BP 128/72 | HR 84 | Temp 97.4°F | Ht 61.0 in | Wt 157.2 lb

## 2021-07-24 DIAGNOSIS — R140 Abdominal distension (gaseous): Secondary | ICD-10-CM | POA: Insufficient documentation

## 2021-07-24 DIAGNOSIS — R220 Localized swelling, mass and lump, head: Secondary | ICD-10-CM

## 2021-07-24 DIAGNOSIS — R1310 Dysphagia, unspecified: Secondary | ICD-10-CM

## 2021-07-24 DIAGNOSIS — K219 Gastro-esophageal reflux disease without esophagitis: Secondary | ICD-10-CM

## 2021-07-24 DIAGNOSIS — K5900 Constipation, unspecified: Secondary | ICD-10-CM

## 2021-07-24 MED ORDER — DEXLANSOPRAZOLE 60 MG PO CPDR: 60.0000 mg | DELAYED_RELEASE_CAPSULE | Freq: Every day | ORAL | 3 refills | Status: DC

## 2021-07-24 NOTE — Telephone Encounter (Signed)
Noted  

## 2021-07-24 NOTE — Patient Instructions (Addendum)
For reflux:  ?Stop Protonix and start Dexilant 60 mg daily. Take this in the morning, but 3 hours after synthroid.  ? ?Follow a GERD diet:  ?Avoid fried, fatty, greasy, spicy, citrus foods. ?Avoid caffeine and carbonated beverages. ?Avoid chocolate. ?Try eating 4-6 small meals a day rather than 3 large meals. ?Do not eat within 3 hours of laying down. ?Prop head of bed up on wood or bricks to create a 6 inch incline. ? ?Avoid ibuprofen as much as possible.  Please talk with your pain doctor to see if they have any other recommendations for pain management for fibromyalgia. ? ?I will talk with Dr. Marletta Lor about the timing of repeating your upper endoscopy to evaluate your swallowing problems and take biopsies of your esophagus to rule out Barrett's. ? ?For constipation: ?Start Linzess 145 mcg daily at least 30 minutes before your first meal.  We are providing you with samples.  Please call with a progress report in 1-2 weeks.  If this works well, I will send in a prescription for you. ?To administration in water: Open capsule and sprinkle entire contents (beads) into a cup with ~30 mL of room temperature bottled water. Gently swirl for at least 20 seconds and swallow immediately; add another 30 mL of water to any remaining beads in cup, swirl for at least 20 seconds, and swallow immediately. Do not store the water-bead mixture for future use. Note: The drug is coated on surface of beads and will dissolve off the beads in water; beads will remain visible and will not dissolve; therefore, it is not necessary to consume all the beads to deliver complete dose.  ? ?Abdominal bloating:  ?Suspect a lot of your symptoms may be secondary to alpha gal as you are experiencing generalized swelling and have been consuming a lot of dairy recently. ?Please follow a strict no dairy diet and continue to avoid all pork, beef, and mammalian derived products. ?If you have any worsening swelling, sensation of your throat closing,  shortness of breath, use your EpiPen and call 911. ?I would also recommend that you call your allergist to discuss your symptoms with them and see if you can get your appointment moved up.  You may need additional testing to rule out other food allergies. ? ? ?Please call with a progress report in a couple of weeks and let me know how you are doing. ? ?We will plan to follow-up in the office in about 2-3 months. Keep your head up!  ? ?Ermalinda Memos, PA-C ?[facility] ? ?

## 2021-07-25 ENCOUNTER — Encounter: Payer: Self-pay | Admitting: Gastroenterology

## 2021-07-31 ENCOUNTER — Ambulatory Visit: Payer: 59 | Admitting: Gastroenterology

## 2021-08-21 ENCOUNTER — Ambulatory Visit: Payer: 59 | Admitting: Internal Medicine

## 2021-10-16 NOTE — Progress Notes (Unsigned)
Referring Provider: Alvina Filbert, MD Primary Care Physician:  Alvina Filbert, MD Primary GI Physician: Dr. Marletta Lor  No chief complaint on file.   HPI:   Carolyn Welch is a 66 y.o. female with history significant for HTN, HLD, GERD with reflux esophagitis, dysphagia with esophageal stenosis noted on EGD in October 2022, gastritis, chronic constipation, alpha gal, presenting today for follow-up of GERD, dysphagia, abdominal bloating, and constipation.   Last seen in our office 07/24/2021.  She was having uncontrolled GERD symptoms on Protonix 40 mg nightly as she felt taking Protonix during the day because of blurry vision.  Previously failed omeprazole.  Query whether symptoms may be influenced by dietary indiscretion in the setting of alpha gal as well as NSAID use.  Planned to change Protonix to Dexilant 60 mg daily.  Reported her dysphagia symptoms improved, but did not resolve after her dilation in October 2022.  At the time of her visit, symptoms were worsening though this was in the setting of facial, tongue, neck swelling x2 months likely secondary to alpha gal with dietary indiscretion.  Noted Dr. Marletta Lor had recommended considering repeat EGD 3 months following her prior EGD in October 2022 for potential redilation and biopsy to evaluate for Barrett's.  We would need to arrange this in the near future, but plan to hold off due to facial and neck swelling.  Reported postprandial bloating x2 weeks which was again suspected to be related to dietary indiscretion.  Previously avoided/consumed minimal dairy, but had recently been consuming dairy on a daily basis.  Recommended strict dairy free diet, and continue to avoid all other mammalian derived products.  Chronic constipation was uncontrolled on magnesium supplement and suppositories as needed.  Failed MiraLAX previously.  Recommended Linzess 145 mcg daily.  Samples were provided and requested progress report in 1-2 weeks.  In regards to  management of her alpha gal and current facial swelling, recommended reaching out to her allergist to let them know of her current symptoms and arrange follow-up.  Also discussed ER precautions.  Plan to follow-up in 2 to 3 months  No progress report received.  Today:   GERD:   Dysphagia:   Abdominal bloating:   Constipation:   Alpha gal:      Past Medical History:  Diagnosis Date   Allergy to alpha-gal    Constipation    Fibromyalgia    GERD (gastroesophageal reflux disease)    Hayfever    Hyperlipidemia    Hypothyroidism     Past Surgical History:  Procedure Laterality Date   BALLOON DILATION N/A 03/11/2021   Procedure: BALLOON DILATION;  Surgeon: Lanelle Bal, DO;  [facility];  Service: Endoscopy;  Laterality: N/A;   BIOPSY  03/11/2021   Procedure: BIOPSY;  Surgeon: Lanelle Bal, DO;  [facility];  Service: Endoscopy;;   CARPAL TUNNEL RELEASE Bilateral    COLONOSCOPY WITH PROPOFOL N/A 09/09/2020   Surgeon: Lanelle Bal, DO;  Nonbleeding internal hemorrhoids, diverticulosis in the sigmoid and descending colon.  Repeat in 10 years.   ESOPHAGOGASTRODUODENOSCOPY (EGD) WITH PROPOFOL N/A 03/11/2021   Surgeon: Lanelle Bal, DO; small hiatal hernia, grade C reflux esophagitis, benign-appearing esophageal stenosis dilated, gastritis biopsied. Pathology with reactive gastropathy with intestinal metaplasia, negative for H. pylori.    Current Outpatient Medications  Medication Sig Dispense Refill   acetaminophen (TYLENOL) 650 MG CR tablet Take 650 mg by mouth every 8 (eight) hours as needed for pain.  Alum Hydroxide-Mag Carbonate (GAVISCON PO) Take 1 tablet by mouth daily as needed (reflux).     Calcium Carbonate Antacid (TUMS PO) Take 1,500 mg by mouth daily as needed (reflux). 750 each     Calcium Citrate-Vitamin D (CITRACAL + D PO) Take 1,200 mg by mouth daily.     cyclobenzaprine (FLEXERIL) 10 MG tablet Take 10 mg by mouth 2  (two) times daily as needed.     dexlansoprazole (DEXILANT) 60 MG capsule Take 1 capsule (60 mg total) by mouth daily. 30 capsule 3   EPINEPHrine 0.3 mg/0.3 mL IJ SOAJ injection Inject 0.3 mg into the muscle as needed.     levothyroxine (SYNTHROID) 88 MCG tablet Take 88 mcg by mouth daily.     loratadine (CLARITIN) 10 MG tablet Take 10 mg by mouth daily.     MAGNESIUM OXIDE PO Take 450 mg by mouth daily.     Multiple Vitamin (MULTIVITAMIN) tablet Take 1 tablet by mouth daily.     spironolactone (ALDACTONE) 100 MG tablet Take 100 mg by mouth 2 (two) times daily.     traZODone (DESYREL) 50 MG tablet Take 100 mg by mouth at bedtime.     VALTREX 500 MG tablet Take 500 mg by mouth 2 (two) times daily.     No current facility-administered medications for this visit.    Allergies as of 10/17/2021 - Review Complete 07/24/2021  Allergen Reaction Noted   Alpha-gal Anaphylaxis and Swelling 03/05/2021   Cephalosporins Hives 08/27/2020   Clindamycin Hives 12/06/2018   Codeine  02/01/2013   Doxycycline Hives 02/01/2013   Sulfa antibiotics  03/17/2021    Family History  Problem Relation Age of Onset   Cancer Mother    Hypertension Mother    Multiple sclerosis Father    Esophageal cancer Maternal Aunt    Colon cancer Paternal Grandmother        diagnosed in her 2890s    Social History   Socioeconomic History   Marital status: Married    Spouse name: Not on file   Number of children: Not on file   Years of education: Not on file   Highest education level: Not on file  Occupational History   Not on file  Tobacco Use   Smoking status: Former   Smokeless tobacco: Never  Vaping Use   Vaping Use: Never used  Substance and Sexual Activity   Alcohol use: Yes    Comment: socially   Drug use: Never   Sexual activity: Not on file  Other Topics Concern   Not on file  Social History Narrative   Not on file   Social Determinants of Health   Financial Resource Strain: Not on file  Food  Insecurity: Not on file  Transportation Needs: Not on file  Physical Activity: Not on file  Stress: Not on file  Social Connections: Not on file    Review of Systems: Gen: Denies fever, chills, cold or flu like symptoms, pre-syncope, or syncope.  CV: Denies chest pain, palpitations. Resp: Denies dyspnea, cough.  GI: See HPI Heme: See HPI  Physical Exam: There were no vitals taken for this visit. General:   Alert and oriented. No distress noted. Pleasant and cooperative.  Head:  Normocephalic and atraumatic. Eyes:  Conjuctiva clear without scleral icterus. Heart:  S1, S2 present without murmurs appreciated. Lungs:  Clear to auscultation bilaterally. No wheezes, rales, or rhonchi. No distress.  Abdomen:  +BS, soft, non-tender and non-distended. No rebound or guarding. No HSM or  masses noted. Msk:  Symmetrical without gross deformities. Normal posture. Extremities:  Without edema. Neurologic:  Alert and  oriented x4 Psych:  Normal mood and affect.    Assessment:     Plan:  ***   Ermalinda Memos, PA-C [facility] 10/17/2021

## 2021-10-17 ENCOUNTER — Other Ambulatory Visit: Payer: Self-pay

## 2021-10-17 ENCOUNTER — Encounter: Payer: Self-pay | Admitting: Gastroenterology

## 2021-10-17 ENCOUNTER — Ambulatory Visit: Payer: 59 | Admitting: Gastroenterology

## 2021-10-17 ENCOUNTER — Telehealth: Payer: Self-pay

## 2021-10-17 VITALS — BP 118/72 | HR 68 | Temp 97.5°F | Ht 61.0 in | Wt 156.2 lb

## 2021-10-17 DIAGNOSIS — K219 Gastro-esophageal reflux disease without esophagitis: Secondary | ICD-10-CM

## 2021-10-17 DIAGNOSIS — R1310 Dysphagia, unspecified: Secondary | ICD-10-CM

## 2021-10-17 DIAGNOSIS — K5900 Constipation, unspecified: Secondary | ICD-10-CM

## 2021-10-17 DIAGNOSIS — R140 Abdominal distension (gaseous): Secondary | ICD-10-CM | POA: Diagnosis not present

## 2021-10-17 MED ORDER — DEXLANSOPRAZOLE 60 MG PO CPDR: 60.0000 mg | DELAYED_RELEASE_CAPSULE | Freq: Every day | ORAL | 3 refills | Status: DC

## 2021-10-17 NOTE — Patient Instructions (Signed)
We will arrange for you to have an upper endoscopy with possible stretching of your esophagus in the near future with Dr. Marletta Lor.  For reflux: Stop Protonix and start Dexilant 60 mg daily.  I have sent a prescription to [facility].  Please let me know if you do not receive this. Follow a GERD diet:  Avoid fried, fatty, greasy, spicy, citrus foods. Avoid caffeine and carbonated beverages. Avoid chocolate. Try eating 4-6 small meals a day rather than 3 large meals. Do not eat within 3 hours of laying down. Prop head of bed up on wood or bricks to create a 6 inch incline.  For constipation: Start MiraLAX 1 capful (17 g) daily in 8 ounces of water, coffee, juice, or other noncarbonated beverage of your choice. After couple weeks, if MiraLAX once daily is not strong enough, you can increase to twice daily.  Continue to limit dairy and all other mammalian derived products due to alpha gal.  Keep your follow-up with your allergist on June 9.  We will follow-up with you in the office after your procedure.  Do not hesitate to call if you have any questions or concerns prior to next visit.  It was great to see you again today!  Ermalinda Memos, PA-C [facility]

## 2021-10-17 NOTE — Progress Notes (Signed)
mbet

## 2021-10-17 NOTE — Telephone Encounter (Signed)
PA for EGD/DIL submitted via [facility] website. PA# [id number], valid 12/02/21-03/02/22.

## 2021-11-05 ENCOUNTER — Telehealth: Payer: Self-pay | Admitting: *Deleted

## 2021-11-05 NOTE — Telephone Encounter (Signed)
Received VM from pt requesting to r/s her procedure with Dr. Marletta Lor on 7/18  Called pt, LMOVM to call back

## 2021-11-06 NOTE — Telephone Encounter (Signed)
Spoke with pt. She will be on vacation from end to June - end of July. Advised will call once we get august schedule

## 2021-11-20 NOTE — Telephone Encounter (Signed)
LMOVM to call back 

## 2021-11-24 ENCOUNTER — Encounter: Payer: Self-pay | Admitting: *Deleted

## 2021-11-28 ENCOUNTER — Telehealth: Payer: Self-pay | Admitting: Internal Medicine

## 2021-11-28 NOTE — Telephone Encounter (Signed)
Pt LMOM that she needed to reschedule her EGD on 7/18 with Dr Marletta Lor. [phone]

## 2021-12-01 NOTE — Telephone Encounter (Signed)
LMOVM to call back. Procedure was already cancelled

## 2021-12-02 ENCOUNTER — Encounter (HOSPITAL_COMMUNITY): Payer: Self-pay

## 2021-12-02 ENCOUNTER — Ambulatory Visit (HOSPITAL_COMMUNITY): Admit: 2021-12-02 | Payer: 59

## 2021-12-02 SURGERY — ESOPHAGOGASTRODUODENOSCOPY (EGD) WITH PROPOFOL
Anesthesia: Monitor Anesthesia Care

## 2021-12-02 NOTE — Telephone Encounter (Signed)
LMOVM to call back 

## 2021-12-02 NOTE — Telephone Encounter (Signed)
Patient returned call. She has been scheduled for 8/25 at 8:45am. Aware will mail new instructions.

## 2021-12-23 ENCOUNTER — Telehealth: Payer: Self-pay | Admitting: *Deleted

## 2021-12-23 NOTE — Telephone Encounter (Signed)
Called pt. Dr. Marletta Lor won't be available on 8/25 and we needed to reschedule procedure. Moved to 9/18 at 8:45am. Aware will mail new instructions.  PA for EGD/DIL submitted via [facility] website. PA# [id number], valid 12/02/21-03/02/22.

## 2022-01-28 ENCOUNTER — Other Ambulatory Visit: Payer: Self-pay

## 2022-01-28 ENCOUNTER — Encounter (HOSPITAL_COMMUNITY): Payer: Self-pay

## 2022-01-29 ENCOUNTER — Encounter (HOSPITAL_COMMUNITY)
Admission: RE | Admit: 2022-01-29 | Discharge: 2022-01-29 | Disposition: A | Discharge status: Home / Self Care | Payer: 59 | Source: Ambulatory Visit | Attending: Internal Medicine | Admitting: Internal Medicine

## 2022-01-29 ENCOUNTER — Other Ambulatory Visit (HOSPITAL_COMMUNITY)
Admission: RE | Admit: 2022-01-29 | Discharge: 2022-01-29 | Disposition: A | Discharge status: Home / Self Care | Payer: 59 | Source: Ambulatory Visit | Attending: Internal Medicine | Admitting: Internal Medicine

## 2022-01-29 DIAGNOSIS — R1310 Dysphagia, unspecified: Secondary | ICD-10-CM | POA: Insufficient documentation

## 2022-01-29 DIAGNOSIS — K219 Gastro-esophageal reflux disease without esophagitis: Secondary | ICD-10-CM | POA: Diagnosis not present

## 2022-01-29 DIAGNOSIS — Z01812 Encounter for preprocedural laboratory examination: Secondary | ICD-10-CM | POA: Diagnosis present

## 2022-01-29 LAB — BASIC METABOLIC PANEL
Anion gap: 8 (ref 5–15)
BUN: 15 mg/dL (ref 8–23)
CO2: 25 mmol/L (ref 22–32)
Calcium: 9.5 mg/dL (ref 8.9–10.3)
Chloride: 105 mmol/L (ref 98–111)
Creatinine, Ser: 0.82 mg/dL (ref 0.44–1.00)
GFR, Estimated: >60 mL/min (ref >60)
Glucose, Bld: 91 mg/dL (ref 70–99)
Potassium: 3.8 mmol/L (ref 3.5–5.1)
Sodium: 138 mmol/L (ref 135–145)

## 2022-02-02 ENCOUNTER — Ambulatory Visit (HOSPITAL_COMMUNITY): Payer: 59 | Admitting: Anesthesiology

## 2022-02-02 ENCOUNTER — Ambulatory Visit (HOSPITAL_BASED_OUTPATIENT_CLINIC_OR_DEPARTMENT_OTHER): Payer: 59 | Admitting: Anesthesiology

## 2022-02-02 ENCOUNTER — Encounter (HOSPITAL_COMMUNITY): Payer: Self-pay

## 2022-02-02 ENCOUNTER — Encounter (HOSPITAL_COMMUNITY)
Admission: RE | Disposition: A | Discharge status: Home / Self Care | Payer: Self-pay | Source: Home / Self Care | Attending: Internal Medicine

## 2022-02-02 ENCOUNTER — Ambulatory Visit (HOSPITAL_COMMUNITY)
Admission: RE | Admit: 2022-02-02 | Discharge: 2022-02-02 | Disposition: A | Discharge status: Home / Self Care | Payer: 59 | Attending: Internal Medicine | Admitting: Internal Medicine

## 2022-02-02 DIAGNOSIS — E039 Hypothyroidism, unspecified: Secondary | ICD-10-CM | POA: Insufficient documentation

## 2022-02-02 DIAGNOSIS — R1310 Dysphagia, unspecified: Secondary | ICD-10-CM | POA: Insufficient documentation

## 2022-02-02 DIAGNOSIS — K449 Diaphragmatic hernia without obstruction or gangrene: Secondary | ICD-10-CM | POA: Insufficient documentation

## 2022-02-02 DIAGNOSIS — K222 Esophageal obstruction: Secondary | ICD-10-CM

## 2022-02-02 DIAGNOSIS — K219 Gastro-esophageal reflux disease without esophagitis: Secondary | ICD-10-CM | POA: Insufficient documentation

## 2022-02-02 DIAGNOSIS — K2970 Gastritis, unspecified, without bleeding: Secondary | ICD-10-CM

## 2022-02-02 DIAGNOSIS — Z87891 Personal history of nicotine dependence: Secondary | ICD-10-CM | POA: Diagnosis not present

## 2022-02-02 DIAGNOSIS — K319 Disease of stomach and duodenum, unspecified: Secondary | ICD-10-CM | POA: Insufficient documentation

## 2022-02-02 HISTORY — PX: BALLOON DILATION: SHX5330

## 2022-02-02 HISTORY — PX: ESOPHAGOGASTRODUODENOSCOPY (EGD) WITH PROPOFOL: SHX5813

## 2022-02-02 HISTORY — PX: BIOPSY: SHX5522

## 2022-02-02 SURGERY — ESOPHAGOGASTRODUODENOSCOPY (EGD) WITH PROPOFOL
Anesthesia: General

## 2022-02-02 MED ORDER — CALCIUM CARBONATE ANTACID 750 MG PO CHEW: 1.0000 | CHEWABLE_TABLET | Freq: Two times a day (BID) | ORAL | Status: DC

## 2022-02-02 MED ORDER — LACTATED RINGERS IV SOLN: INTRAVENOUS | Status: DC

## 2022-02-02 MED ORDER — LIDOCAINE 2% (20 MG/ML) 5 ML SYRINGE
INTRAMUSCULAR | Status: DC | PRN
  Administered 2022-02-02: 60 mg via INTRAVENOUS

## 2022-02-02 MED ORDER — OMEPRAZOLE MAGNESIUM 20 MG PO TBEC: 20.0000 mg | DELAYED_RELEASE_TABLET | Freq: Two times a day (BID) | ORAL | 11 refills | Status: DC

## 2022-02-02 MED ORDER — PROPOFOL 10 MG/ML IV BOLUS
INTRAVENOUS | Status: DC | PRN
  Administered 2022-02-02: 60 mg via INTRAVENOUS

## 2022-02-02 MED ORDER — LACTATED RINGERS IV SOLN: INTRAVENOUS | Status: DC | PRN

## 2022-02-02 MED ORDER — PROPOFOL 500 MG/50ML IV EMUL
INTRAVENOUS | Status: DC | PRN
  Administered 2022-02-02: 150 ug/kg/min via INTRAVENOUS

## 2022-02-02 NOTE — Transfer of Care (Signed)
Immediate Anesthesia Transfer of Care Note  Patient: Carolyn Welch  Procedure(s) Performed: ESOPHAGOGASTRODUODENOSCOPY (EGD) WITH PROPOFOL BALLOON DILATION BIOPSY  Patient Location: PACU  Anesthesia Type:General  Level of Consciousness: awake  Airway & Oxygen Therapy: Patient Spontanous Breathing  Post-op Assessment: Report given to RN and Post -op Vital signs reviewed and stable  Post vital signs: Reviewed and stable  Last Vitals:  Vitals Value Taken Time  BP    Temp    Pulse    Resp    SpO2      Last Pain:  Vitals:   02/02/22 0806  TempSrc: Oral  PainSc: 5       Patients Stated Pain Goal: 8 (15/40/08 6761)  Complications: No notable events documented.

## 2022-02-02 NOTE — H&P (Signed)
Primary Care Physician:  Alvina Filbert, MD Primary Gastroenterologist:  Dr. Marletta Lor  Pre-Procedure History & Physical: HPI:  Carolyn Welch is a 66 y.o. female is here for an EGD with possible dilation to be performed for GERD and dysphagia.   Past Medical History:  Diagnosis Date   Allergy to alpha-gal    Constipation    Fibromyalgia    GERD (gastroesophageal reflux disease)    Hayfever    Hyperlipidemia    Hypothyroidism     Past Surgical History:  Procedure Laterality Date   BALLOON DILATION N/A 03/11/2021   Procedure: BALLOON DILATION;  Surgeon: Lanelle Bal, DO;  Location: [facility];  Service: Endoscopy;  Laterality: N/A;   BIOPSY  03/11/2021   Procedure: BIOPSY;  Surgeon: Lanelle Bal, DO;  Location: [facility];  Service: Endoscopy;;   CARPAL TUNNEL RELEASE Bilateral    CATARACT EXTRACTION Left    CESAREAN SECTION     x3   COLONOSCOPY WITH PROPOFOL N/A 09/09/2020   Surgeon: Lanelle Bal, DO;  Nonbleeding internal hemorrhoids, diverticulosis in the sigmoid and descending colon.  Repeat in 10 years.   ESOPHAGOGASTRODUODENOSCOPY (EGD) WITH PROPOFOL N/A 03/11/2021   Surgeon: Lanelle Bal, DO; small hiatal hernia, grade C reflux esophagitis, benign-appearing esophageal stenosis dilated, gastritis biopsied. Pathology with reactive gastropathy with intestinal metaplasia, negative for H. pylori.   SINUS EXPLORATION     TONSILLECTOMY      Prior to Admission medications   Medication Sig Start Date End Date Taking? Authorizing Provider  Alum Hydroxide-Mag Trisilicate (GAVISCON) 80-14.2 MG CHEW Chew 2 tablets by mouth daily as needed (indigestion).   Yes [provider]  calcium carbonate (TUMS EX) 750 MG chewable tablet Chew 1,500 tablets by mouth daily as needed for heartburn.   Yes [provider]  diphenhydrAMINE (BENADRYL) 25 MG tablet Take 50 mg by mouth every 6 (six) hours as needed for allergies.   Yes [provider]   EPINEPHrine 0.3 mg/0.3 mL IJ SOAJ injection Inject 0.3 mg into the muscle as needed for anaphylaxis. 09/26/20  Yes [provider]  hydrocortisone 2.5 % cream Apply 1 Application topically 2 (two) times daily as needed (rosacea). 10/28/21  Yes [provider]  ibuprofen (ADVIL) 200 MG tablet Take 200 mg by mouth every 6 (six) hours as needed for moderate pain.   Yes [provider]  levothyroxine (SYNTHROID) 100 MCG tablet Take 100 mcg by mouth daily before breakfast.   Yes [provider]  loratadine (CLARITIN) 10 MG tablet Take 10 mg by mouth daily.   Yes [provider]  metroNIDAZOLE (METROCREAM) 0.75 % cream Apply 1 Application topically at bedtime as needed (rosacea). 10/24/21  Yes [provider]  mupirocin ointment (BACTROBAN) 2 % Apply 1 Application topically 3 (three) times daily as needed (rosacea). 10/28/21  Yes [provider]  omeprazole (PRILOSEC OTC) 20 MG tablet Take 20 mg by mouth daily.   Yes [provider]  spironolactone (ALDACTONE) 100 MG tablet Take 100 mg by mouth 2 (two) times daily. 06/26/20  Yes [provider]  traZODone (DESYREL) 100 MG tablet Take 100 mg by mouth at bedtime.   Yes [provider]  VALTREX 500 MG tablet Take 500 mg by mouth 2 (two) times daily. 02/18/21  Yes [provider]  acetaminophen (TYLENOL) 650 MG CR tablet Take 650-1,300 mg by mouth every 8 (eight) hours as needed for pain.    [provider]  cyclobenzaprine (FLEXERIL) 10  MG tablet Take 10 mg by mouth 2 (two) times daily as needed for muscle spasms.    [provider]  dexlansoprazole (DEXILANT) 60 MG capsule Take 1 capsule (60 mg total) by mouth daily. Patient not taking: Reported on 01/23/2022 10/17/21   Erenest Rasher, PA-C    Allergies as of 12/02/2021 - Review Complete 10/17/2021  Allergen Reaction Noted   Alpha-gal Anaphylaxis and Swelling 03/05/2021   Cephalosporins Hives  08/27/2020   Clindamycin Hives 12/06/2018   Codeine  02/01/2013   Doxycycline Hives 02/01/2013   Sulfa antibiotics  03/17/2021    Family History  Problem Relation Age of Onset   Cancer Mother    Hypertension Mother    Multiple sclerosis Father    Esophageal cancer Maternal Aunt    Colon cancer Paternal Grandmother        diagnosed in her 2s    Social History   Socioeconomic History   Marital status: Married    Spouse name: Not on file   Number of children: Not on file   Years of education: Not on file   Highest education level: Not on file  Occupational History   Not on file  Tobacco Use   Smoking status: Former    Packs/day: 0.50    Years: 13.00    Total pack years: 6.50    Types: Cigarettes   Smokeless tobacco: Never  Vaping Use   Vaping Use: Never used  Substance and Sexual Activity   Alcohol use: Yes    Comment: socially   Drug use: Never   Sexual activity: Yes  Other Topics Concern   Not on file  Social History Narrative   Not on file   Social Determinants of Health   Financial Resource Strain: Not on file  Food Insecurity: Not on file  Transportation Needs: Not on file  Physical Activity: Not on file  Stress: Not on file  Social Connections: Not on file  Intimate Partner Violence: Not on file    Review of Systems: General: Negative for fever, chills, fatigue, weakness. Eyes: Negative for vision changes.  ENT: Negative for hoarseness, difficulty swallowing , nasal congestion. CV: Negative for chest pain, angina, palpitations, dyspnea on exertion, peripheral edema.  Respiratory: Negative for dyspnea at rest, dyspnea on exertion, cough, sputum, wheezing.  GI: See history of present illness. GU:  Negative for dysuria, hematuria, urinary incontinence, urinary frequency, nocturnal urination.  MS: Negative for joint pain, low back pain.  Derm: Negative for rash or itching.  Neuro: Negative for weakness, abnormal sensation, seizure, frequent  headaches, memory loss, confusion.  Psych: Negative for anxiety, depression Endo: Negative for unusual weight change.  Heme: Negative for bruising or bleeding. Allergy: Negative for rash or hives.  Physical Exam: Vital signs in last 24 hours: Temp:  [97.8 F (36.6 C)] 97.8 F (36.6 C) (09/18 0806) Pulse Rate:  [58] 58 (09/18 0806) Resp:  [18] 18 (09/18 0806) BP: (119)/(51) 119/51 (09/18 0806) SpO2:  [99 %] 99 % (09/18 0806)   General:   Alert,  Well-developed, well-nourished, pleasant and cooperative in NAD Head:  Normocephalic and atraumatic. Eyes:  Sclera clear, no icterus.   Conjunctiva pink. Ears:  Normal auditory acuity. Nose:  No deformity, discharge,  or lesions. Mouth:  No deformity or lesions, dentition normal. Neck:  Supple; no masses or thyromegaly. Lungs:  Clear throughout to auscultation.   No wheezes, crackles, or rhonchi. No acute distress. Heart:  Regular rate and rhythm; no murmurs, clicks, rubs,  or  gallops. Abdomen:  Soft, nontender and nondistended. No masses, hepatosplenomegaly or hernias noted. Normal bowel sounds, without guarding, and without rebound.   Msk:  Symmetrical without gross deformities. Normal posture. Extremities:  Without clubbing or edema. Neurologic:  Alert and  oriented x4;  grossly normal neurologically. Skin:  Intact without significant lesions or rashes. Cervical Nodes:  No significant cervical adenopathy. Psych:  Alert and cooperative. Normal mood and affect.   Impression/Plan: Carolyn Welch is here for an EGD with possible dilation to be performed for GERD and dysphagia.   Risks, benefits, limitations, imponderables and alternatives regarding EGD have been reviewed with the patient. Questions have been answered. All parties agreeable.

## 2022-02-02 NOTE — Anesthesia Postprocedure Evaluation (Signed)
Anesthesia Post Note  Patient: Carolyn Welch  Procedure(s) Performed: ESOPHAGOGASTRODUODENOSCOPY (EGD) WITH PROPOFOL BALLOON DILATION BIOPSY  Patient location during evaluation: Phase II Anesthesia Type: General Level of consciousness: awake Pain management: pain level controlled Vital Signs Assessment: post-procedure vital signs reviewed and stable Respiratory status: spontaneous breathing and respiratory function stable Cardiovascular status: blood pressure returned to baseline and stable Postop Assessment: no headache and no apparent nausea or vomiting Anesthetic complications: no Comments: Late entry   No notable events documented.   Last Vitals:  Vitals:   02/02/22 0806 02/02/22 0921  BP: (!) 119/51 (!) 98/46  Pulse: (!) 58 65  Resp: 18 17  Temp: 36.6 C 36.6 C  SpO2: 99% 96%    Last Pain:  Vitals:   02/02/22 0921  TempSrc: Oral  PainSc: 0-No pain                 Louann Sjogren

## 2022-02-02 NOTE — Op Note (Signed)
[facility] Patient Name: Carolyn Welch Procedure Date: 02/02/2022 8:53 AM MRN: [id number] Date of Birth: [birth year] Attending MD: Elon Alas. Abbey Chatters DO CSN: [id number] Age: 66 Admit Type: Outpatient Procedure:                Upper GI endoscopy Indications:              Dysphagia, Heartburn Providers:                Elon Alas. Abbey Chatters, DO, Lurline Del, RN, Kristine L.                            Risa Grill, Technician Referring MD:              Medicines:                See the Anesthesia note for documentation of the                            administered medications Complications:            No immediate complications. Estimated Blood Loss:     Estimated blood loss was minimal. Procedure:                Pre-Anesthesia Assessment:                           - The anesthesia plan was to use monitored                            anesthesia care (MAC).                           After obtaining informed consent, the endoscope was                            passed under direct vision. Throughout the                            procedure, the patient's blood pressure, pulse, and                            oxygen saturations were monitored continuously. The                            GIF-H190 ([id number]) scope was introduced through the                            mouth, and advanced to the second part of duodenum.                            The upper GI endoscopy was accomplished without                            difficulty. The patient tolerated the procedure                            well. Scope In: 2:54:27  AM Scope Out: 9:15:35 AM Total Procedure Duration: 0 hours 6 minutes 41 seconds  Findings:      A 3 cm hiatal hernia was present.      A mild Schatzki ring was found in the distal esophagus. A TTS dilator       was passed through the scope. Dilation with an 18-19-20 mm balloon       dilator was performed to 20 mm. The dilation site was examined and       showed mild mucosal  disruption and moderate improvement in luminal       narrowing.      Diffuse moderate inflammation characterized by erythema and linear       erosions was found in the entire examined stomach. Biopsies were taken       with a cold forceps for Helicobacter pylori testing.      The duodenal bulb, first portion of the duodenum and second portion of       the duodenum were normal. Impression:               - 3 cm hiatal hernia.                           - Mild Schatzki ring. Dilated.                           - Gastritis. Biopsied.                           - Normal duodenal bulb, first portion of the                            duodenum and second portion of the duodenum. Moderate Sedation:      Per Anesthesia Care Recommendation:           - Patient has a contact number available for                            emergencies. The signs and symptoms of potential                            delayed complications were discussed with the                            patient. Return to normal activities tomorrow.                            Written discharge instructions were provided to the                            patient.                           - Resume previous diet.                           - Continue present medications.                           -  Await pathology results.                           - Repeat upper endoscopy PRN for retreatment.                           - Return to GI clinic in 4 months.                           - Use a proton pump inhibitor PO BID. Procedure Code(s):        --- Professional ---                           [id number], Esophagogastroduodenoscopy, flexible,                            transoral; with transendoscopic balloon dilation of                            esophagus (less than 30 mm diameter)                           43239, 59, Esophagogastroduodenoscopy, flexible,                            transoral; with biopsy, single or multiple Diagnosis Code(s):         --- Professional ---                           K44.9, Diaphragmatic hernia without obstruction or                            gangrene                           K22.2, Esophageal obstruction                           K29.70, Gastritis, unspecified, without bleeding                           R13.10, Dysphagia, unspecified                           R12, Heartburn CPT copyright 2019 [employer] Medical Association. All rights reserved. The codes documented in this report are preliminary and upon coder review may  be revised to meet current compliance requirements. Elon Alas. Abbey Chatters, DO [place] Abbey Chatters, DO 02/02/2022 9:18:09 AM This report has been signed electronically. Number of Addenda: 0

## 2022-02-02 NOTE — Discharge Instructions (Signed)
EGD Discharge instructions Please read the instructions outlined below and refer to this sheet in the next few weeks. These discharge instructions provide you with general information on caring for yourself after you leave the hospital. Your doctor may also give you specific instructions. While your treatment has been planned according to the most current medical practices available, unavoidable complications occasionally occur. If you have any problems or questions after discharge, please call your doctor. ACTIVITY You may resume your regular activity but move at a slower pace for the next 24 hours.  Take frequent rest periods for the next 24 hours.  Walking will help expel (get rid of) the air and reduce the bloated feeling in your abdomen.  No driving for 24 hours (because of the anesthesia (medicine) used during the test).  You may shower.  Do not sign any important legal documents or operate any machinery for 24 hours (because of the anesthesia used during the test).  NUTRITION Drink plenty of fluids.  You may resume your normal diet.  Begin with a light meal and progress to your normal diet.  Avoid alcoholic beverages for 24 hours or as instructed by your caregiver.  MEDICATIONS You may resume your normal medications unless your caregiver tells you otherwise.  WHAT YOU CAN EXPECT TODAY You may experience abdominal discomfort such as a feeling of fullness or "gas" pains.  FOLLOW-UP Your doctor will discuss the results of your test with you.  SEEK IMMEDIATE MEDICAL ATTENTION IF ANY OF THE FOLLOWING OCCUR: Excessive nausea (feeling sick to your stomach) and/or vomiting.  Severe abdominal pain and distention (swelling).  Trouble swallowing.  Temperature over 101 F (37.8 C).  Rectal bleeding or vomiting of blood.    Your EGD revealed mild amount inflammation in your stomach.  I took biopsies of this to rule out infection with a bacteria called H. pylori.  Await pathology results, my  office will contact you.  You again had a hiatal hernia as well as Schatzki's ring.  I dilated this again today up to 20 mm compared to 18 prior.  Hopefully this helps with your swallowing.  I want you to increase your omeprazole to twice daily.  Follow-up in 3 to 4 months.  I hope you have a great rest of your week!  Elon Alas. Abbey Chatters, D.O. Gastroenterology and Hepatology [facility]

## 2022-02-02 NOTE — Anesthesia Preprocedure Evaluation (Signed)
Anesthesia Evaluation  Patient identified by MRN, date of birth, ID band Patient awake    Reviewed: Allergy & Precautions, H&P , NPO status , Patient's Chart, lab work & pertinent test results, reviewed documented beta blocker date and time   Airway Mallampati: II  TM Distance: >3 FB Neck ROM: full    Dental no notable dental hx.    Pulmonary neg pulmonary ROS, former smoker,    Pulmonary exam normal breath sounds clear to auscultation       Cardiovascular Exercise Tolerance: Good negative cardio ROS   Rhythm:regular Rate:Normal     Neuro/Psych  Neuromuscular disease negative psych ROS   GI/Hepatic Neg liver ROS, GERD  Medicated,  Endo/Other  Hypothyroidism   Renal/GU negative Renal ROS  negative genitourinary   Musculoskeletal   Abdominal   Peds  Hematology negative hematology ROS (+)   Anesthesia Other Findings   Reproductive/Obstetrics negative OB ROS                             Anesthesia Physical Anesthesia Plan  ASA: 2  Anesthesia Plan: General   Post-op Pain Management:    Induction:   PONV Risk Score and Plan: Propofol infusion  Airway Management Planned:   Additional Equipment:   Intra-op Plan:   Post-operative Plan:   Informed Consent: I have reviewed the patients History and Physical, chart, labs and discussed the procedure including the risks, benefits and alternatives for the proposed anesthesia with the patient or authorized representative who has indicated his/her understanding and acceptance.     Dental Advisory Given  Plan Discussed with: CRNA  Anesthesia Plan Comments:         Anesthesia Quick Evaluation

## 2022-02-03 LAB — SURGICAL PATHOLOGY

## 2022-02-09 ENCOUNTER — Encounter (HOSPITAL_COMMUNITY): Payer: Self-pay | Admitting: Internal Medicine

## 2022-06-01 ENCOUNTER — Encounter: Payer: Self-pay | Admitting: Internal Medicine

## 2022-07-16 ENCOUNTER — Ambulatory Visit (INDEPENDENT_AMBULATORY_CARE_PROVIDER_SITE_OTHER): Payer: Medicare Other | Admitting: Internal Medicine

## 2022-07-16 ENCOUNTER — Encounter: Payer: Self-pay | Admitting: Internal Medicine

## 2022-07-16 VITALS — BP 108/68 | HR 76 | Temp 98.1°F | Ht 61.5 in | Wt 166.8 lb

## 2022-07-16 DIAGNOSIS — R1010 Upper abdominal pain, unspecified: Secondary | ICD-10-CM | POA: Diagnosis not present

## 2022-07-16 DIAGNOSIS — K2100 Gastro-esophageal reflux disease with esophagitis, without bleeding: Secondary | ICD-10-CM

## 2022-07-16 DIAGNOSIS — R140 Abdominal distension (gaseous): Secondary | ICD-10-CM

## 2022-07-16 DIAGNOSIS — R1319 Other dysphagia: Secondary | ICD-10-CM

## 2022-07-16 NOTE — Patient Instructions (Addendum)
I will give you samples of Voquezna today for your chronic acid reflux.  Let me know in a week or so how you are doing and I will send in formal prescription if improved.  Do not take omeprazole with this medication.  I will also order a gastric emptying study today as well.  We will call with results.  I will discuss your case further with Dr. Jenetta Downer to see if you would be a good candidate for transoral incisionless fundoplication.  If so, his nurse will call you to set up an appointment with him to discuss further.  It was very nice seeing you again today.  Dr. Abbey Chatters

## 2022-07-16 NOTE — Progress Notes (Signed)
Referring Provider: Abran Richard, MD Primary Care Physician:  Abran Richard, MD Primary GI:  Dr. Abbey Chatters  Chief Complaint  Patient presents with   Follow-up    GERD and EGD follow up    HPI:   Carolyn Welch is a 67 y.o. female who presents to the clinic today for follow-up.  History of chronic GERD not ideally controlled, dysphagia, alpha gal.  EGD 02/02/2022 3 cm hiatal hernia, mild Schatzki's ring status post dilation.  Mild gastritis biopsies negative for H. pylori.  Today,  GERD:  Currently taking omeprazole 20 mg twice daily.  Previously tried Dexilant, did not tolerate due to side effects.  Trialed and failed Protonix twice daily.  Continues to have breakthrough symptoms of burning.  Dysphagia:  Improved status post esophageal dilation.   Alpha gal:  Had cut out dairy completely and changed cosmetics and swelling and abdominal bloating improved. Started adding a little dairy back here and there and is doing well. Still with some tongue swelling, but this is improving.  No shortness of breath.  Following with allergy.  Also complaining about epigastric discomfort abdominal bloating.  States she was told years ago that her stomach does not empty well.  Is interested in having gastric emptying study ordered today.  No history of diabetes.  No chronic opioids.  Past Medical History:  Diagnosis Date   Allergy to alpha-gal    Constipation    Fibromyalgia    GERD (gastroesophageal reflux disease)    Hayfever    Hyperlipidemia    Hypothyroidism     Past Surgical History:  Procedure Laterality Date   BALLOON DILATION N/A 03/11/2021   Procedure: BALLOON DILATION;  Surgeon: Eloise Harman, DO;  [facility];  Service: Endoscopy;  Laterality: N/A;   BALLOON DILATION N/A 02/02/2022   Procedure: BALLOON DILATION;  Surgeon: Eloise Harman, DO;  [facility];  Service: Endoscopy;  Laterality: N/A;   BIOPSY  03/11/2021   Procedure: BIOPSY;   Surgeon: Eloise Harman, DO;  [facility];  Service: Endoscopy;;   BIOPSY  02/02/2022   Procedure: BIOPSY;  Surgeon: Eloise Harman, DO;  [facility];  Service: Endoscopy;;  gastric   CARPAL TUNNEL RELEASE Bilateral    CATARACT EXTRACTION Left    CESAREAN SECTION     x3   COLONOSCOPY WITH PROPOFOL N/A 09/09/2020   Surgeon: Eloise Harman, DO;  Nonbleeding internal hemorrhoids, diverticulosis in the sigmoid and descending colon.  Repeat in 10 years.   ESOPHAGOGASTRODUODENOSCOPY (EGD) WITH PROPOFOL N/A 03/11/2021   Surgeon: Eloise Harman, DO; small hiatal hernia, grade C reflux esophagitis, benign-appearing esophageal stenosis dilated, gastritis biopsied. Pathology with reactive gastropathy with intestinal metaplasia, negative for H. pylori.   ESOPHAGOGASTRODUODENOSCOPY (EGD) WITH PROPOFOL N/A 02/02/2022   Procedure: ESOPHAGOGASTRODUODENOSCOPY (EGD) WITH PROPOFOL;  Surgeon: Eloise Harman, DO;  [facility];  Service: Endoscopy;  Laterality: N/A;  8:45am   SINUS EXPLORATION     TONSILLECTOMY      Current Outpatient Medications  Medication Sig Dispense Refill   acetaminophen (TYLENOL) 650 MG CR tablet Take 650-1,300 mg by mouth every 8 (eight) hours as needed for pain.     Alum Hydroxide-Mag Trisilicate (GAVISCON) A999333 MG CHEW Chew 2 tablets by mouth daily as needed (indigestion).     calcium carbonate (TUMS EX) 750 MG chewable tablet Chew 1 tablet (750 mg total) by mouth 2 (two) times daily. As needed for heartburn  cyclobenzaprine (FLEXERIL) 10 MG tablet Take 10 mg by mouth 2 (two) times daily as needed for muscle spasms.     diphenhydrAMINE (BENADRYL) 25 MG tablet Take 50 mg by mouth every 6 (six) hours as needed for allergies.     hydrocortisone 2.5 % cream Apply 1 Application topically 2 (two) times daily as needed (rosacea).     ibuprofen (ADVIL) 200 MG tablet Take 200 mg by mouth every 6 (six) hours as needed for moderate pain.      levothyroxine (SYNTHROID) 100 MCG tablet Take 100 mcg by mouth daily before breakfast.     loratadine (CLARITIN) 10 MG tablet Take 10 mg by mouth daily.     metroNIDAZOLE (METROCREAM) 0.75 % cream Apply 1 Application topically at bedtime as needed (rosacea).     mupirocin ointment (BACTROBAN) 2 % Apply 1 Application topically 3 (three) times daily as needed (rosacea).     omeprazole (PRILOSEC OTC) 20 MG tablet Take 1 tablet (20 mg total) by mouth 2 (two) times daily. 60 tablet 11   spironolactone (ALDACTONE) 100 MG tablet Take 100 mg by mouth 2 (two) times daily.     traZODone (DESYREL) 100 MG tablet Take 100 mg by mouth at bedtime.     VALTREX 500 MG tablet Take 500 mg by mouth 2 (two) times daily.     EPINEPHrine 0.3 mg/0.3 mL IJ SOAJ injection Inject 0.3 mg into the muscle as needed for anaphylaxis. (Patient not taking: Reported on 07/16/2022)     No current facility-administered medications for this visit.    Allergies as of 07/16/2022 - Review Complete 07/16/2022  Allergen Reaction Noted   Alpha-gal Anaphylaxis and Swelling 03/05/2021   Cephalosporins Hives 08/27/2020   Clindamycin Hives 12/06/2018   Codeine Itching 02/01/2013   Doxycycline Hives 02/01/2013   Sulfa antibiotics  03/17/2021    Family History  Problem Relation Age of Onset   Cancer Mother    Hypertension Mother    Multiple sclerosis Father    Esophageal cancer Maternal Aunt    Colon cancer Paternal Grandmother        diagnosed in her 40s    Social History   Socioeconomic History   Marital status: Married    Spouse name: Not on file   Number of children: Not on file   Years of education: Not on file   Highest education level: Not on file  Occupational History   Not on file  Tobacco Use   Smoking status: Former    Packs/day: 0.50    Years: 13.00    Total pack years: 6.50    Types: Cigarettes   Smokeless tobacco: Never  Vaping Use   Vaping Use: Never used  Substance and Sexual Activity   Alcohol  use: Yes    Comment: socially   Drug use: Never   Sexual activity: Yes  Other Topics Concern   Not on file  Social History Narrative   Not on file   Social Determinants of Health   Financial Resource Strain: Not on file  Food Insecurity: Not on file  Transportation Needs: Not on file  Physical Activity: Not on file  Stress: Not on file  Social Connections: Not on file    Subjective: Review of Systems  Constitutional:  Negative for chills and fever.  HENT:  Negative for congestion and hearing loss.   Eyes:  Negative for blurred vision and double vision.  Respiratory:  Negative for cough and shortness of breath.   Cardiovascular:  Negative  for chest pain and palpitations.  Gastrointestinal:  Positive for abdominal pain and heartburn. Negative for blood in stool, constipation, diarrhea, melena and vomiting.       Dysphagia  Genitourinary:  Negative for dysuria and urgency.  Musculoskeletal:  Negative for joint pain and myalgias.  Skin:  Negative for itching and rash.  Neurological:  Negative for dizziness and headaches.  Psychiatric/Behavioral:  Negative for depression. The patient is not nervous/anxious.      Objective: BP 108/68   Pulse 76   Temp 98.1 F (36.7 C)   Ht 5' 1.5" (1.562 m)   Wt 166 lb 12.8 oz (75.7 kg)   BMI 31.01 kg/m  Physical Exam Constitutional:      Appearance: Normal appearance.  HENT:     Head: Normocephalic and atraumatic.  Eyes:     Extraocular Movements: Extraocular movements intact.     Conjunctiva/sclera: Conjunctivae normal.  Cardiovascular:     Rate and Rhythm: Normal rate and regular rhythm.  Pulmonary:     Effort: Pulmonary effort is normal.     Breath sounds: Normal breath sounds.  Abdominal:     General: Bowel sounds are normal.     Palpations: Abdomen is soft.  Musculoskeletal:        General: No swelling. Normal range of motion.     Cervical back: Normal range of motion and neck supple.  Skin:    General: Skin is warm  and dry.     Coloration: Skin is not jaundiced.  Neurological:     General: No focal deficit present.     Mental Status: She is alert and oriented to person, place, and time.  Psychiatric:        Mood and Affect: Mood normal.        Behavior: Behavior normal.      Assessment: *Chronic GERD-not well controlled  *Esophageal dysphagia-Improved s/p dilation *Hiatal hernia  *Alpha gal  Plan: Chronic GERD not well-controlled on omeprazole twice daily.  Has trialed and failed Dexilant and pantoprazole as well.  Samples given of Voquezna 20 mg daily.  Call with update next week and I will send in formal prescription if improved.  Will order gastric emptying study today to further evaluate her bloating and epigastric pain.  Patient is interested in possible TIF, I will discuss further with Dr. Jenetta Downer and potentially schedule an appointment with him to talk further.  Otherwise follow-up in 3 to 4 months.  07/16/2022 10:49 AM   Disclaimer: This note was dictated with voice recognition software. Similar sounding words can inadvertently be transcribed and may not be corrected upon review.

## 2022-07-23 ENCOUNTER — Encounter (HOSPITAL_COMMUNITY)
Admission: RE | Admit: 2022-07-23 | Discharge: 2022-07-23 | Disposition: A | Discharge status: Home / Self Care | Payer: Medicare Other | Source: Ambulatory Visit | Attending: Internal Medicine | Admitting: Internal Medicine

## 2022-07-23 ENCOUNTER — Encounter (HOSPITAL_COMMUNITY): Payer: Self-pay

## 2022-07-23 DIAGNOSIS — R140 Abdominal distension (gaseous): Secondary | ICD-10-CM | POA: Insufficient documentation

## 2022-07-23 DIAGNOSIS — R1010 Upper abdominal pain, unspecified: Secondary | ICD-10-CM | POA: Insufficient documentation

## 2022-07-24 ENCOUNTER — Telehealth: Payer: Self-pay | Admitting: *Deleted

## 2022-07-24 NOTE — Telephone Encounter (Signed)
Pt left message wanting to reschedule her procedure.  [place]

## 2022-07-29 ENCOUNTER — Encounter (HOSPITAL_COMMUNITY): Payer: Self-pay

## 2022-07-29 ENCOUNTER — Encounter (HOSPITAL_COMMUNITY)
Admission: RE | Admit: 2022-07-29 | Discharge: 2022-07-29 | Disposition: A | Discharge status: Home / Self Care | Payer: Medicare Other | Source: Ambulatory Visit | Attending: Internal Medicine | Admitting: Internal Medicine

## 2022-07-29 DIAGNOSIS — R1010 Upper abdominal pain, unspecified: Secondary | ICD-10-CM | POA: Diagnosis present

## 2022-07-29 DIAGNOSIS — R140 Abdominal distension (gaseous): Secondary | ICD-10-CM | POA: Diagnosis present

## 2022-07-29 MED ORDER — TECHNETIUM TC 99M SULFUR COLLOID
2.0000 | Freq: Once | INTRAVENOUS | Status: AC | PRN
  Administered 2022-07-29: 2 via ORAL

## 2022-08-03 ENCOUNTER — Ambulatory Visit (INDEPENDENT_AMBULATORY_CARE_PROVIDER_SITE_OTHER): Payer: Medicare Other | Admitting: Gastroenterology

## 2022-08-20 NOTE — Telephone Encounter (Signed)
Pt called back and needed to reschedule her appointment with Dr.Castaneda. gave pt number to main office.

## 2022-08-27 ENCOUNTER — Telehealth (INDEPENDENT_AMBULATORY_CARE_PROVIDER_SITE_OTHER): Payer: Medicare Other | Admitting: Gastroenterology

## 2022-09-09 ENCOUNTER — Encounter (INDEPENDENT_AMBULATORY_CARE_PROVIDER_SITE_OTHER): Payer: Self-pay | Admitting: Gastroenterology

## 2022-09-09 ENCOUNTER — Ambulatory Visit (INDEPENDENT_AMBULATORY_CARE_PROVIDER_SITE_OTHER): Payer: Medicare Other | Admitting: Gastroenterology

## 2022-09-09 VITALS — Ht 61.0 in | Wt 163.0 lb

## 2022-09-09 DIAGNOSIS — K2100 Gastro-esophageal reflux disease with esophagitis, without bleeding: Secondary | ICD-10-CM | POA: Diagnosis not present

## 2022-09-09 DIAGNOSIS — K449 Diaphragmatic hernia without obstruction or gangrene: Secondary | ICD-10-CM | POA: Diagnosis not present

## 2022-09-09 NOTE — Patient Instructions (Signed)
Continue omeprazole 20 mg qday, consider increasing back to twice a day dosing. Refer to Dr. Lovell Sheehan for evaluation for cTIF - hiatal hernia repair. Schedule barium esophagram with pill

## 2022-09-09 NOTE — Progress Notes (Signed)
Katrinka Blazing, M.D. Gastroenterology & Hepatology [facility] Gastroenterology [address] [place], [place] [address] Primary Care Physician: Alvina Filbert, MD [address] [place] [address]  Referring MD: Earnest Bailey, DO  This is a telephone virtual visit.  It required patient-provider interaction for the medical decision making as documented below. The patient has consented and agreed to proceed with a Telehealth encounter.  VIRTUAL VISIT NOTE Patient location: Home Provider location: Office  I will communicate my assessment and recommendations to the referring MD via EMR.  Chief Complaint: TIF Evaluation history of GERD  History of Present Illness: Carolyn Welch is a 67 y.o. female with past medical history of fibromyalgia, GERD, hyperlipidemia, hypothyroidism, alpha gal, who presents for evaluation of GERD and candidacy for TIF.  Patient was seen by Dr. Marletta Lor on 07/16/2022 for management of chronic GERD.  Has presented persistent symptoms of GERD despite trying multiple PPIs.  Was switched to Voquezna 20 mg daily.  Frequency of heartburn episodes:  Patient has presented heartburn symptoms since she was in her 45s. Felt her initial presentation started when her father was diagnosed with MS and had a lot of stress. This subsided eventually - she stopped drinking alcohol at that time. However, 2 years ago she presented recurrent episodes of regurgitation acid contents when laying down - sometimes coming through her nose. She was also presenting frequent heartburn, for which she was started on omeprazole 20 mg qday.  Eventually she was advised to increase to twice daily dosing but given her concerns for osteoporosis she has only been taking it once a day.  She is still presenting heartburn daily, for which she takes Gaviscon or Tums. Did not want to try higher dose PPI due to concern for osteoporosis. She did not try Voquezna.  Also reports  in the morning having a bad taste in her mouth and sore throat. Also reports she had a broken tooth recently, which was thought to be secondary to large amount of acid reflux. She is supposed to have a dental implant performed.  She is also concerned about osteoporosis as she has osteopenia and had borderline score in her DEXA scan. Would like to avoid using PPI.  Triggering foods: spicy, acidic foods, tomatoes  Nighttime episodes: Having regurgitation some nights, which wakes her in the middle of the night.  Odynophagia: Sometimes has burning sensation in he mid chest when swallowing, usually when drinking coffee in the AM  Dysphagia: Appears dysphagia but improved with empiric dilation with esophagogastroduodenospy. She is eating regular food except pork or beef due to alpha gal. Drinks plenty of fluids to prevent any dysphagia events.  The patient denies having any nausea, vomiting, fever, chills, hematochezia, melena, hematemesis, abdominal distention, abdominal pain, diarrhea, jaundice, pruritus or weight loss.  Notably, the patient had a gastric emptying study on 07/29/2022 that was within normal limits.  Previously used PPIs: Omeprazole 20 mg twice a day, Dexilant (had side effects), Protonix twice daily.  Last EGD: 02/02/2022, presence of a 3 cm hiatal hernia and a mild Schatzki's ring which was dilated, gastritis (normal gastric mucosa without H. pylori), normal duodenum.  Previous EGD on 2022 had grade C esophagitis.  Past Medical History: Past Medical History:  Diagnosis Date   Allergy to alpha-gal    Constipation    Fibromyalgia    GERD (gastroesophageal reflux disease)    Hayfever    Hyperlipidemia    Hypothyroidism     Past Surgical History: Past Surgical  History:  Procedure Laterality Date   BALLOON DILATION N/A 03/11/2021   Procedure: BALLOON DILATION;  Surgeon: Lanelle Bal, DO;  [facility];  Service: Endoscopy;  Laterality: N/A;   BALLOON  DILATION N/A 02/02/2022   Procedure: BALLOON DILATION;  Surgeon: Lanelle Bal, DO;  [facility];  Service: Endoscopy;  Laterality: N/A;   BIOPSY  03/11/2021   Procedure: BIOPSY;  Surgeon: Lanelle Bal, DO;  [facility];  Service: Endoscopy;;   BIOPSY  02/02/2022   Procedure: BIOPSY;  Surgeon: Lanelle Bal, DO;  [facility];  Service: Endoscopy;;  gastric   CARPAL TUNNEL RELEASE Bilateral    CATARACT EXTRACTION Left    CESAREAN SECTION     x3   COLONOSCOPY WITH PROPOFOL N/A 09/09/2020   Surgeon: Lanelle Bal, DO;  Nonbleeding internal hemorrhoids, diverticulosis in the sigmoid and descending colon.  Repeat in 10 years.   ESOPHAGOGASTRODUODENOSCOPY (EGD) WITH PROPOFOL N/A 03/11/2021   Surgeon: Lanelle Bal, DO; small hiatal hernia, grade C reflux esophagitis, benign-appearing esophageal stenosis dilated, gastritis biopsied. Pathology with reactive gastropathy with intestinal metaplasia, negative for H. pylori.   ESOPHAGOGASTRODUODENOSCOPY (EGD) WITH PROPOFOL N/A 02/02/2022   Procedure: ESOPHAGOGASTRODUODENOSCOPY (EGD) WITH PROPOFOL;  Surgeon: Lanelle Bal, DO;  [facility];  Service: Endoscopy;  Laterality: N/A;  8:45am   SINUS EXPLORATION     TONSILLECTOMY      Family History: Family History  Problem Relation Age of Onset   Cancer Mother    Hypertension Mother    Multiple sclerosis Father    Esophageal cancer Maternal Aunt    Colon cancer Paternal Grandmother        diagnosed in her 66s    Social History: Social History   Tobacco Use  Smoking Status Former   Packs/day: 0.50   Years: 13.00   Additional pack years: 0.00   Total pack years: 6.50   Types: Cigarettes  Smokeless Tobacco Never   Social History   Substance and Sexual Activity  Alcohol Use Yes   Comment: socially   Social History   Substance and Sexual Activity  Drug Use Never    Allergies: Allergies  Allergen Reactions    Alpha-Gal Anaphylaxis and Swelling   Cephalosporins Hives   Clindamycin Hives   Codeine Itching   Doxycycline Hives   Sulfa Antibiotics     Redness     Medications: Current Outpatient Medications  Medication Sig Dispense Refill   acetaminophen (TYLENOL) 650 MG CR tablet Take 650-1,300 mg by mouth every 8 (eight) hours as needed for pain.     Alum Hydroxide-Mag Trisilicate (GAVISCON) 80-14.2 MG CHEW Chew 2 tablets by mouth daily as needed (indigestion).     calcium carbonate (TUMS EX) 750 MG chewable tablet Chew 1 tablet (750 mg total) by mouth 2 (two) times daily. As needed for heartburn (Patient taking differently: Chew 2 tablets by mouth as needed. As needed for heartburn)     cyclobenzaprine (FLEXERIL) 10 MG tablet Take 10 mg by mouth 2 (two) times daily as needed for muscle spasms.     diphenhydrAMINE (BENADRYL) 25 MG tablet Take 50 mg by mouth every 6 (six) hours as needed for allergies.     EPINEPHrine 0.3 mg/0.3 mL IJ SOAJ injection Inject 0.3 mg into the muscle as needed for anaphylaxis.     hydrocortisone 2.5 % cream Apply 1 Application topically 2 (two) times daily as needed (rosacea).     ibuprofen (  ADVIL) 200 MG tablet Take 200 mg by mouth every 6 (six) hours as needed for moderate pain.     levothyroxine (SYNTHROID) 100 MCG tablet Take 100 mcg by mouth daily before breakfast.     loratadine (CLARITIN) 10 MG tablet Take 10 mg by mouth daily.     mupirocin ointment (BACTROBAN) 2 % Apply 1 Application topically 3 (three) times daily as needed (rosacea).     omeprazole (PRILOSEC OTC) 20 MG tablet Take 1 tablet (20 mg total) by mouth 2 (two) times daily. (Patient taking differently: Take 20 mg by mouth daily.) 60 tablet 11   spironolactone (ALDACTONE) 100 MG tablet Take 100 mg by mouth 2 (two) times daily.     traZODone (DESYREL) 100 MG tablet Take 100 mg by mouth at bedtime.     VALTREX 500 MG tablet Take 500 mg by mouth 2 (two) times daily.     No current facility-administered  medications for this visit.    Review of Systems: GENERAL: negative for malaise, night sweats HEENT: No changes in hearing or vision, no nose bleeds or other nasal problems. NECK: Negative for lumps, goiter, pain and significant neck swelling RESPIRATORY: Negative for cough, wheezing CARDIOVASCULAR: Negative for chest pain, leg swelling, palpitations, orthopnea GI: SEE HPI MUSCULOSKELETAL: Negative for joint pain or swelling, back pain, and muscle pain. SKIN: Negative for lesions, rash PSYCH: Negative for sleep disturbance, mood disorder and recent psychosocial stressors. HEMATOLOGY Negative for prolonged bleeding, bruising easily, and swollen nodes. ENDOCRINE: Negative for cold or heat intolerance, polyuria, polydipsia and goiter. NEURO: negative for tremor, gait imbalance, syncope and seizures. The remainder of the review of systems is noncontributory.  Physical Exam: No exam was performed as this was a telephone encounter  Imaging/Labs: as above  I personally reviewed and interpreted the available labs, imaging and endoscopic files.  Impression and Plan: Carolyn Welch is a 67 y.o. female with past medical history of fibromyalgia, GERD, hyperlipidemia, hypothyroidism, alpha gal, who presents for evaluation of GERD and candidacy for TIF.  Patient is presenting persistent episode of symptomatic reflux despite taking low-dose PPI on a daily basis.  She had endoscopic evidence of GERD given the presence of grade C esophagitis.  Fortunately, her last EGD showed resolution of inflammation but required to be dilated Schatzki's ring.  Currently not presenting dysphagia.  The patient and I held a thorough discussion about potential nonpharmacologic treatments for reflux such as transoral Incisionless Fundoplication (TIF).  Given the presence of a hiatal hernia larger than 2 cm, she will benefit more from combo TIF. The details of the procedure, benefits and risks, as well as prognosis with  this intervention was thoroughly discussed with the patient who understood and agreed.  Dietary modifications and post procedural recommendations were also discussed the patient.  The patient will read more about this procedure -pamphlet will be sent by mail.  I will refer her to Dr. Lovell Sheehan for preprocedural evaluation of hiatal hernia repair for cTIF.  Will also order a barium esophagram at this time.  Patient believes that it will be difficult for her to follow the postprocedure recommendations very compliantly as she is taking multiple trips during the next months and may only be able to do so around October 2024.  As she is presenting persistent symptoms I advised her to increase her PPI to twice a day but she is hesitant to do this due to her osteopenia.  She may also try using the samples of Voquezna provided by Dr. Marletta Lor.  -  Continue omeprazole 20 mg qday, consider increasing back to twice a day dosing. - Refer to Dr. Lovell Sheehan for evaluation for cTIF - hiatal hernia repair. - Schedule barium esophagram with pill  All questions were answered.      Total visit time: I spent a total of  40 minutes  Katrinka Blazing, MD Gastroenterology and Hepatology [facility] Gastroenterology

## 2022-09-10 ENCOUNTER — Telehealth (INDEPENDENT_AMBULATORY_CARE_PROVIDER_SITE_OTHER): Payer: Self-pay | Admitting: Gastroenterology

## 2022-09-10 ENCOUNTER — Telehealth (INDEPENDENT_AMBULATORY_CARE_PROVIDER_SITE_OTHER): Payer: Self-pay | Admitting: Internal Medicine

## 2022-09-10 NOTE — Telephone Encounter (Signed)
Spoke with pt and advised her of Dr.Castaneda recommendation. Pt states "well I wish you wouldn't have told Dr.Castaneda, that's a little awkward". Pt states she would like to talk to Dr.Carver at hiatal hernia surgery and another surgery.

## 2022-09-10 NOTE — Telephone Encounter (Signed)
Pt left voicemail and states that she is unable to make the esophagram appt on 09/15/22. Pt would like to cancel the esophagram appt. Pt would like to Dr.Carver about TIF procedure. Pt states Dr.Castaneda gave a non reassuring answering when she asked how many times he had done this. Pt states she is not comfortable with Dr.Castaneda. Please advise. Thank you

## 2022-09-10 NOTE — Telephone Encounter (Signed)
Esophagram scheduled for 09/15/22 @ 9:30 [employer]. Arrive at 9:15am. Npo 3 hours prior to exam. Left detailed message on voicemail.

## 2022-09-10 NOTE — Telephone Encounter (Signed)
Thanks for the update. Please let her know Dr. Marletta Lor does not do the TIF procedure (this is the reason why he referred her case to me). I understand her concerns. If interested in having the cTIF elsewhere, she can be referred to Doristine Locks at [place].

## 2022-09-15 ENCOUNTER — Ambulatory Visit (HOSPITAL_COMMUNITY): Payer: Medicare Other

## 2022-09-15 NOTE — Telephone Encounter (Signed)
Please schedule OV with me, thank you.

## 2022-09-16 ENCOUNTER — Ambulatory Visit: Payer: Medicare Other | Admitting: Internal Medicine

## 2022-09-19 IMAGING — US US ABDOMEN LIMITED
1 series · 14 of 25 positions shown · non-contrast
Comparison: None.

CLINICAL DATA: Right upper quadrant tenderness/upper abdominal
pain. GERD

EXAM:
ULTRASOUND ABDOMEN LIMITED RIGHT UPPER QUADRANT

[Series 1: us abdomen limited ruq (liver/gb) · 14 of 45 slices shown]
[im 1/45]
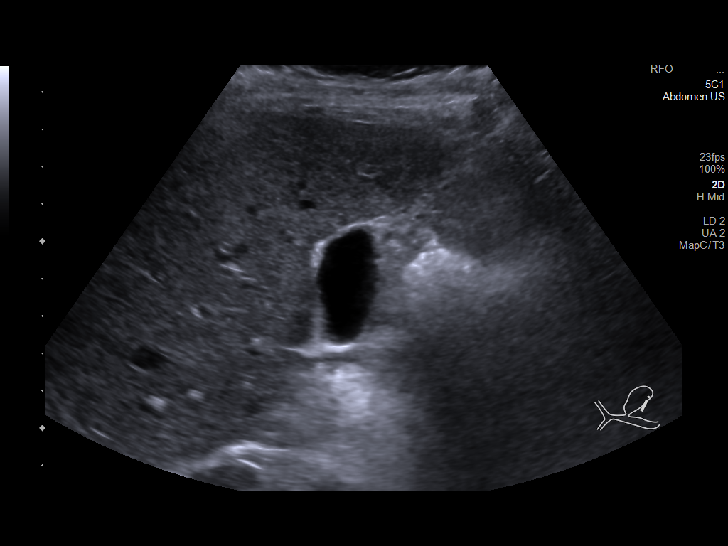
[im 4/45]
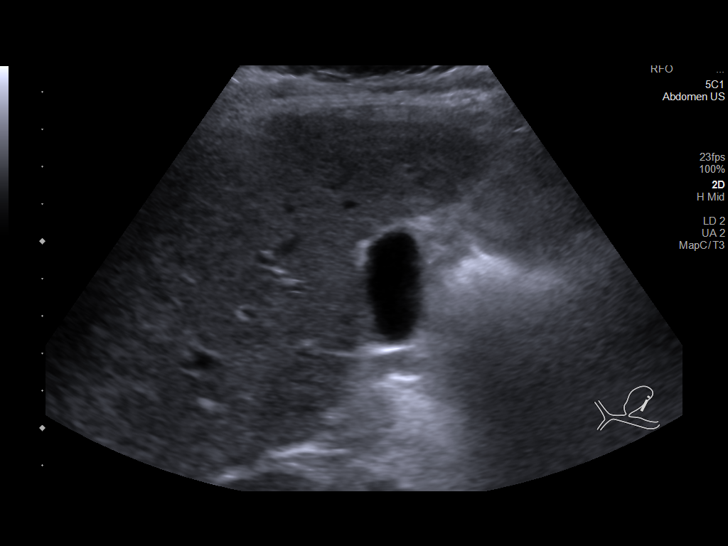
[im 8/45]
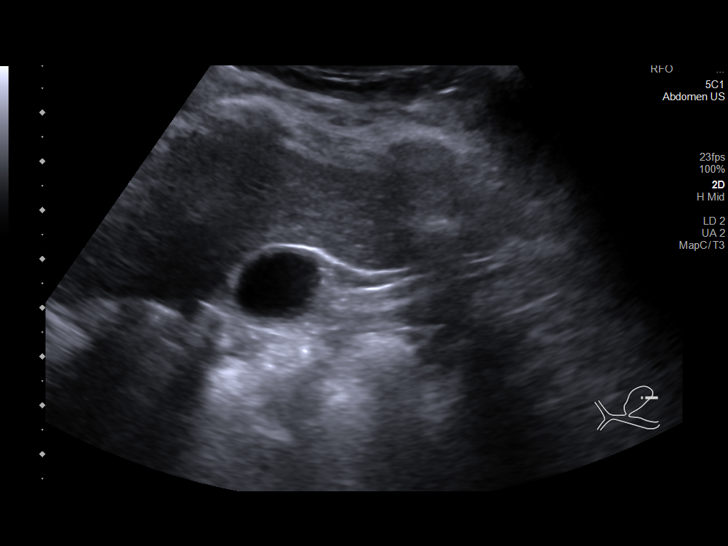
[im 12/45]
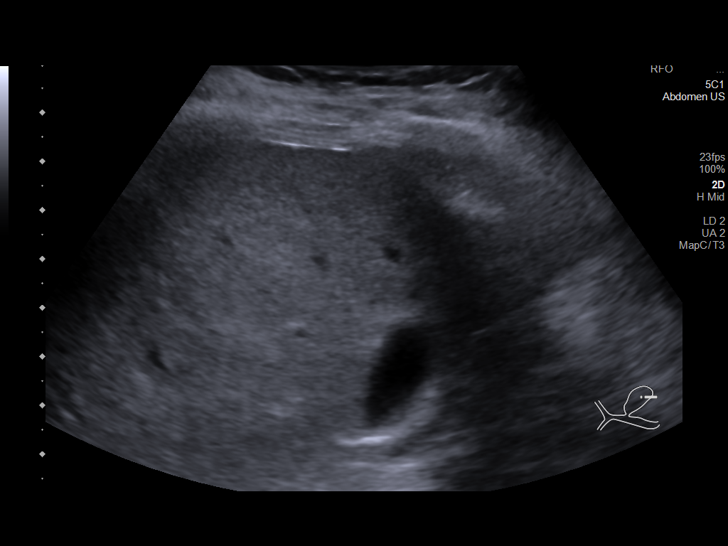
[im 15/45]
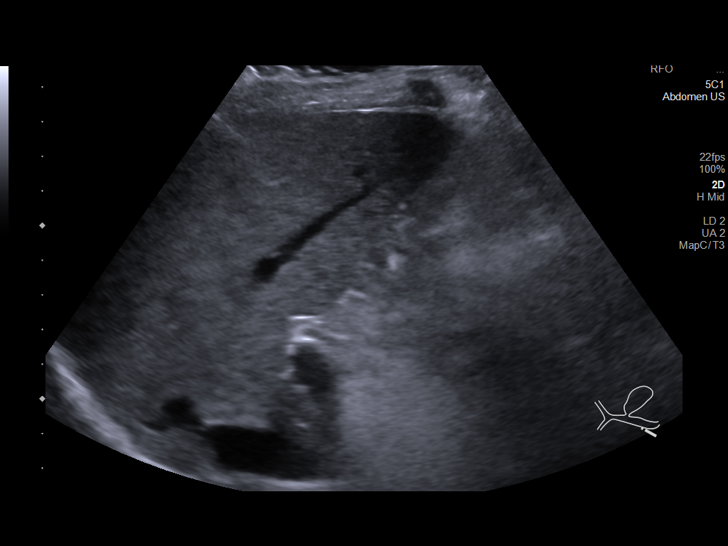
[im 17/45]
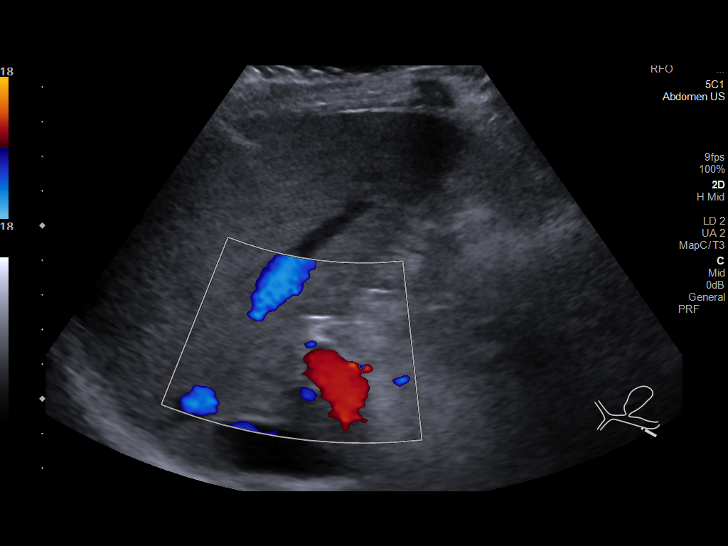
[im 21/45]
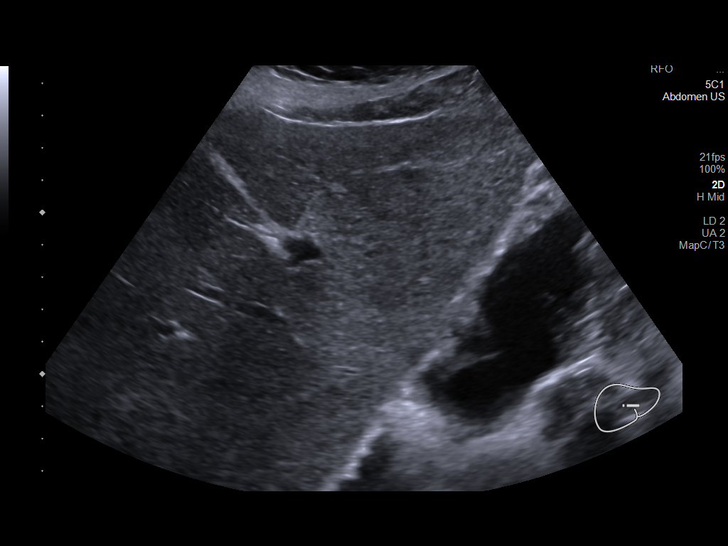
[im 24/45]
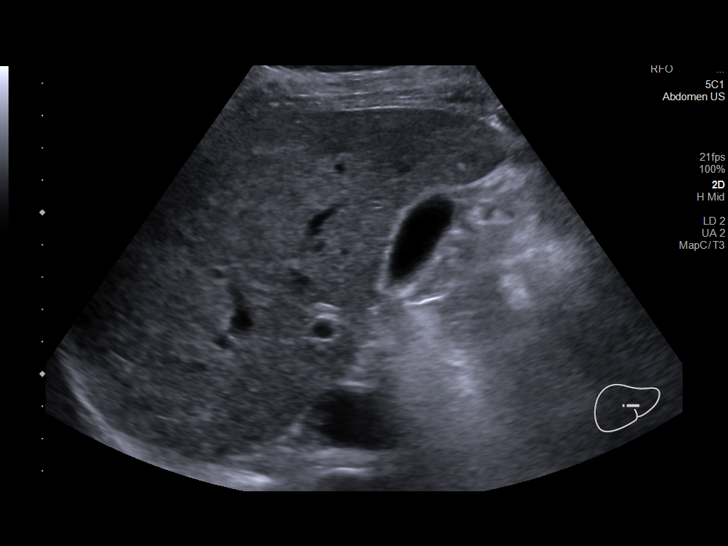
[im 28/45]
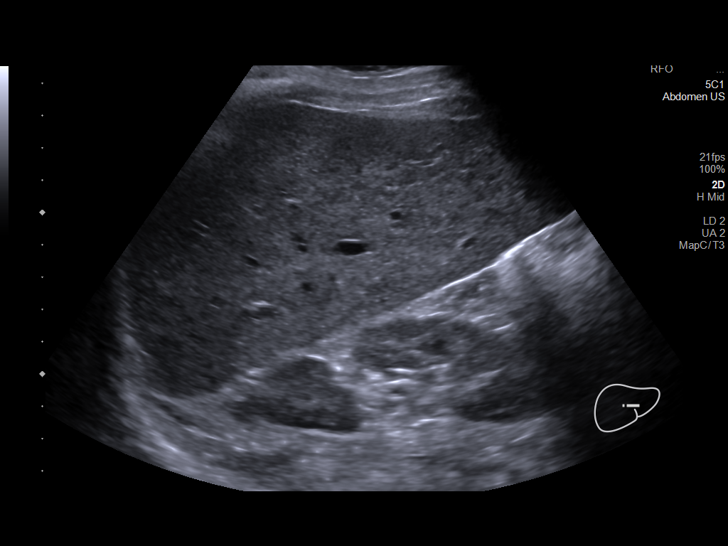
[im 30/45]
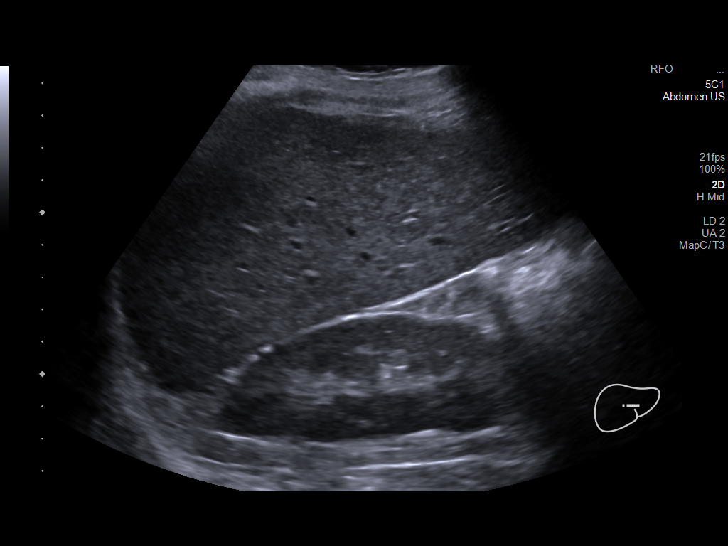
[im 34/45]
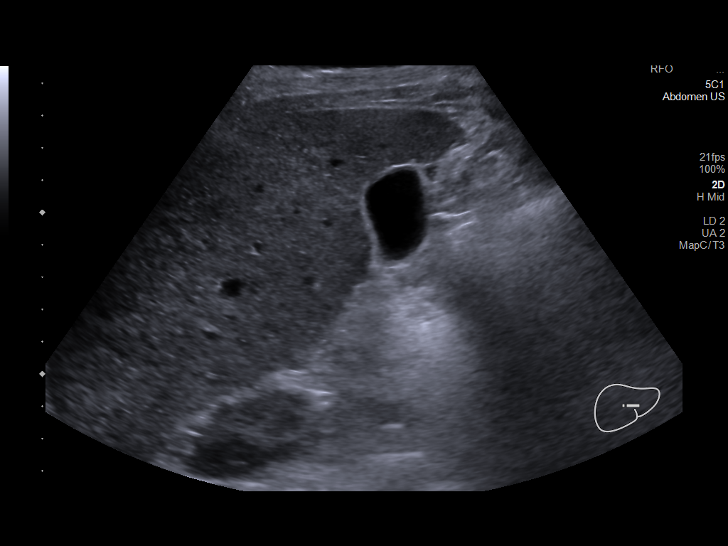
[im 37/45]
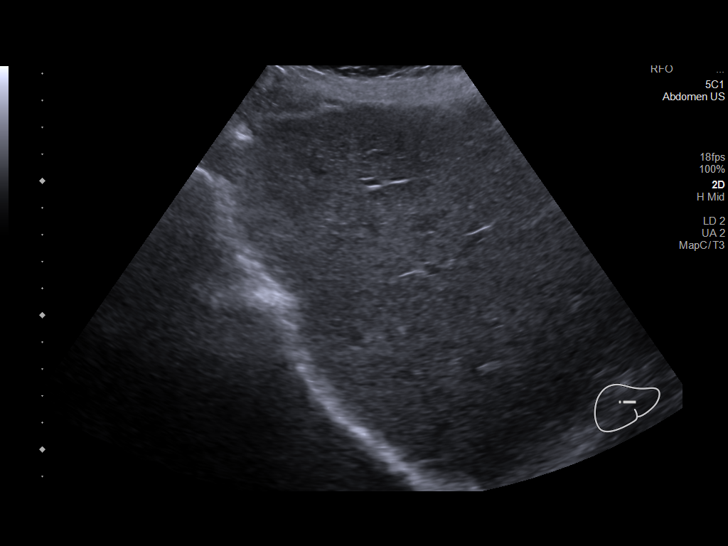
[im 41/45]
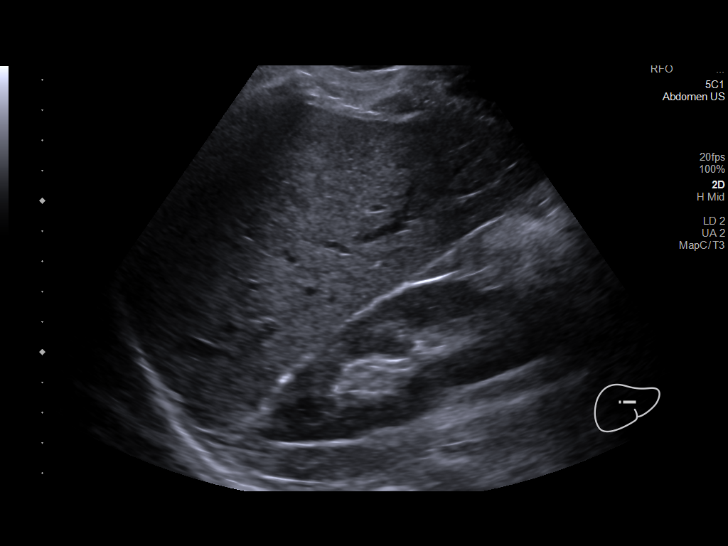
[im 45/45]
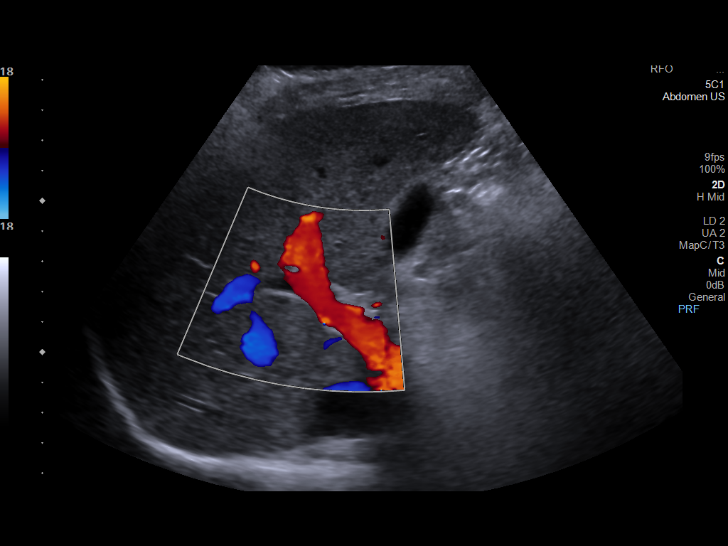

[14 of 25 positions shown; findings below may reference images not displayed]

FINDINGS: Gallbladder:

No gallstones or wall thickening visualized. No sonographic Murphy
sign noted by sonographer.

Common bile duct:

Diameter: 3mm

Liver:

No focal lesion identified. Normal parenchymal echogenicity. Portal
vein is patent on color Doppler imaging with normal direction of
blood flow towards the liver.

Other: None.
IMPRESSION: Unremarkable right upper quadrant ultrasound.

## 2022-10-29 ENCOUNTER — Ambulatory Visit: Payer: Medicare Other | Admitting: Internal Medicine

## 2022-11-18 ENCOUNTER — Other Ambulatory Visit: Payer: Self-pay

## 2022-11-18 ENCOUNTER — Inpatient Hospital Stay (HOSPITAL_COMMUNITY)
Admission: EM | Admit: 2022-11-18 | Discharge: 2022-11-22 | DRG: 398 | Disposition: A | Discharge status: Home / Self Care | Payer: Medicare Other | Attending: Family Medicine | Admitting: Family Medicine

## 2022-11-18 ENCOUNTER — Inpatient Hospital Stay (HOSPITAL_COMMUNITY): Payer: Medicare Other | Admitting: Certified Registered"

## 2022-11-18 ENCOUNTER — Encounter (HOSPITAL_COMMUNITY)
Admission: EM | Disposition: A | Discharge status: Home / Self Care | Payer: Self-pay | Source: Home / Self Care | Attending: Family Medicine

## 2022-11-18 ENCOUNTER — Emergency Department (HOSPITAL_COMMUNITY): Payer: Medicare Other

## 2022-11-18 ENCOUNTER — Encounter (HOSPITAL_COMMUNITY): Payer: Self-pay | Admitting: Family Medicine

## 2022-11-18 DIAGNOSIS — Z79899 Other long term (current) drug therapy: Secondary | ICD-10-CM | POA: Diagnosis not present

## 2022-11-18 DIAGNOSIS — K219 Gastro-esophageal reflux disease without esophagitis: Secondary | ICD-10-CM | POA: Diagnosis present

## 2022-11-18 DIAGNOSIS — Z87891 Personal history of nicotine dependence: Secondary | ICD-10-CM | POA: Diagnosis not present

## 2022-11-18 DIAGNOSIS — K567 Ileus, unspecified: Secondary | ICD-10-CM | POA: Diagnosis present

## 2022-11-18 DIAGNOSIS — Z8249 Family history of ischemic heart disease and other diseases of the circulatory system: Secondary | ICD-10-CM

## 2022-11-18 DIAGNOSIS — K449 Diaphragmatic hernia without obstruction or gangrene: Secondary | ICD-10-CM | POA: Diagnosis present

## 2022-11-18 DIAGNOSIS — Z79890 Hormone replacement therapy (postmenopausal): Secondary | ICD-10-CM | POA: Diagnosis not present

## 2022-11-18 DIAGNOSIS — J9811 Atelectasis: Secondary | ICD-10-CM | POA: Diagnosis present

## 2022-11-18 DIAGNOSIS — K353 Acute appendicitis with localized peritonitis, without perforation or gangrene: Secondary | ICD-10-CM

## 2022-11-18 DIAGNOSIS — Z809 Family history of malignant neoplasm, unspecified: Secondary | ICD-10-CM | POA: Diagnosis not present

## 2022-11-18 DIAGNOSIS — I10 Essential (primary) hypertension: Secondary | ICD-10-CM | POA: Diagnosis present

## 2022-11-18 DIAGNOSIS — E039 Hypothyroidism, unspecified: Secondary | ICD-10-CM | POA: Diagnosis present

## 2022-11-18 DIAGNOSIS — Z881 Allergy status to other antibiotic agents status: Secondary | ICD-10-CM

## 2022-11-18 DIAGNOSIS — Z885 Allergy status to narcotic agent status: Secondary | ICD-10-CM | POA: Diagnosis not present

## 2022-11-18 DIAGNOSIS — Z820 Family history of epilepsy and other diseases of the nervous system: Secondary | ICD-10-CM | POA: Diagnosis not present

## 2022-11-18 DIAGNOSIS — Z888 Allergy status to other drugs, medicaments and biological substances status: Secondary | ICD-10-CM | POA: Diagnosis not present

## 2022-11-18 DIAGNOSIS — R682 Dry mouth, unspecified: Secondary | ICD-10-CM | POA: Diagnosis present

## 2022-11-18 DIAGNOSIS — Z882 Allergy status to sulfonamides status: Secondary | ICD-10-CM | POA: Diagnosis not present

## 2022-11-18 DIAGNOSIS — M797 Fibromyalgia: Secondary | ICD-10-CM | POA: Diagnosis present

## 2022-11-18 DIAGNOSIS — Z800 Family history of malignant neoplasm of digestive organs: Secondary | ICD-10-CM

## 2022-11-18 DIAGNOSIS — D62 Acute posthemorrhagic anemia: Secondary | ICD-10-CM | POA: Diagnosis present

## 2022-11-18 DIAGNOSIS — E785 Hyperlipidemia, unspecified: Secondary | ICD-10-CM | POA: Diagnosis present

## 2022-11-18 DIAGNOSIS — K3532 Acute appendicitis with perforation and localized peritonitis, without abscess: Secondary | ICD-10-CM | POA: Diagnosis present

## 2022-11-18 DIAGNOSIS — K3580 Unspecified acute appendicitis: Principal | ICD-10-CM | POA: Diagnosis present

## 2022-11-18 DIAGNOSIS — L299 Pruritus, unspecified: Secondary | ICD-10-CM | POA: Diagnosis present

## 2022-11-18 DIAGNOSIS — K9189 Other postprocedural complications and disorders of digestive system: Secondary | ICD-10-CM | POA: Diagnosis present

## 2022-11-18 HISTORY — PX: XI ROBOTIC LAPAROSCOPIC ASSISTED APPENDECTOMY: SHX6877

## 2022-11-18 HISTORY — PX: ROBOTIC ASSISTED LAPAROSCOPIC LYSIS OF ADHESION: SHX6080

## 2022-11-18 LAB — CBC
HCT: 41.6 % (ref 36.0–46.0)
Hemoglobin: 14.4 g/dL (ref 12.0–15.0)
MCH: 34 pg (ref 26.0–34.0)
MCHC: 34.6 g/dL (ref 30.0–36.0)
MCV: 98.1 fL (ref 80.0–100.0)
Platelets: 262 10*3/uL (ref 150–400)
RBC: 4.24 MIL/uL (ref 3.87–5.11)
RDW: 12.6 % (ref 11.5–15.5)
WBC: 9 10*3/uL (ref 4.0–10.5)
nRBC: 0 % (ref 0.0–0.2)

## 2022-11-18 LAB — COMPREHENSIVE METABOLIC PANEL
ALT: 21 U/L (ref 0–44)
AST: 24 U/L (ref 15–41)
Albumin: 4 g/dL (ref 3.5–5.0)
Alkaline Phosphatase: 51 U/L (ref 38–126)
Anion gap: 10 (ref 5–15)
BUN: 25 mg/dL — ABNORMAL HIGH (ref 8–23)
CO2: 25 mmol/L (ref 22–32)
Calcium: 9 mg/dL (ref 8.9–10.3)
Chloride: 98 mmol/L (ref 98–111)
Creatinine, Ser: 0.74 mg/dL (ref 0.44–1.00)
GFR, Estimated: >60 mL/min (ref >60)
Glucose, Bld: 151 mg/dL — ABNORMAL HIGH (ref 70–99)
Potassium: 3.8 mmol/L (ref 3.5–5.1)
Sodium: 133 mmol/L — ABNORMAL LOW (ref 135–145)
Total Bilirubin: 1.2 mg/dL (ref 0.3–1.2)
Total Protein: 7.1 g/dL (ref 6.5–8.1)

## 2022-11-18 LAB — URINALYSIS, ROUTINE W REFLEX MICROSCOPIC
Bilirubin Urine: NEGATIVE
Glucose, UA: NEGATIVE mg/dL
Hgb urine dipstick: NEGATIVE
Ketones, ur: NEGATIVE mg/dL
Leukocytes,Ua: NEGATIVE
Nitrite: NEGATIVE
Protein, ur: NEGATIVE mg/dL
Specific Gravity, Urine: 1.005 (ref 1.005–1.030)
pH: 6.5 (ref 5.0–8.0)

## 2022-11-18 LAB — LIPASE, BLOOD: Lipase: 29 U/L (ref 11–51)

## 2022-11-18 SURGERY — APPENDECTOMY, ROBOT-ASSISTED, LAPAROSCOPIC
Anesthesia: General | Site: Abdomen

## 2022-11-18 MED ORDER — FENTANYL CITRATE (PF) 250 MCG/5ML IJ SOLN
INTRAMUSCULAR | Status: AC
  Filled 2022-11-18: qty 5

## 2022-11-18 MED ORDER — LACTATED RINGERS IV SOLN: INTRAVENOUS | Status: DC | PRN

## 2022-11-18 MED ORDER — SODIUM CHLORIDE 0.9 % IV BOLUS: 1000.0000 mL | Freq: Once | INTRAVENOUS | Status: DC

## 2022-11-18 MED ORDER — CHLORHEXIDINE GLUCONATE CLOTH 2 % EX PADS: 6.0000 | MEDICATED_PAD | Freq: Once | CUTANEOUS | Status: DC

## 2022-11-18 MED ORDER — LEVOTHYROXINE SODIUM 100 MCG PO TABS
100.0000 ug | ORAL_TABLET | Freq: Every day | ORAL | Status: DC
  Administered 2022-11-19 – 2022-11-22 (×4): 100 ug via ORAL
  Filled 2022-11-18 (×4): qty 1

## 2022-11-18 MED ORDER — SODIUM CHLORIDE 0.9 % IV SOLN: Freq: Once | INTRAVENOUS | Status: AC

## 2022-11-18 MED ORDER — HYDROMORPHONE HCL 1 MG/ML IJ SOLN: 0.5000 mg | INTRAMUSCULAR | Status: DC | PRN

## 2022-11-18 MED ORDER — SUCCINYLCHOLINE CHLORIDE 200 MG/10ML IV SOSY
PREFILLED_SYRINGE | INTRAVENOUS | Status: DC | PRN
  Administered 2022-11-18: 100 mg via INTRAVENOUS

## 2022-11-18 MED ORDER — FENTANYL CITRATE (PF) 250 MCG/5ML IJ SOLN
INTRAMUSCULAR | Status: DC | PRN
  Administered 2022-11-18 (×3): 50 ug via INTRAVENOUS

## 2022-11-18 MED ORDER — ONDANSETRON HCL 4 MG/2ML IJ SOLN
INTRAMUSCULAR | Status: AC
  Filled 2022-11-18: qty 2

## 2022-11-18 MED ORDER — VASOPRESSIN 20 UNIT/ML IV SOLN
INTRAVENOUS | Status: DC | PRN
  Administered 2022-11-18: 2 [IU] via INTRAVENOUS

## 2022-11-18 MED ORDER — ROCURONIUM BROMIDE 100 MG/10ML IV SOLN
INTRAVENOUS | Status: DC | PRN
  Administered 2022-11-18: 40 mg via INTRAVENOUS
  Administered 2022-11-18: 10 mg via INTRAVENOUS

## 2022-11-18 MED ORDER — LIDOCAINE 2% (20 MG/ML) 5 ML SYRINGE
INTRAMUSCULAR | Status: DC | PRN
  Administered 2022-11-18: 100 mg via INTRAVENOUS

## 2022-11-18 MED ORDER — ONDANSETRON HCL 4 MG/2ML IJ SOLN
4.0000 mg | Freq: Four times a day (QID) | INTRAMUSCULAR | Status: DC | PRN
  Administered 2022-11-18 – 2022-11-21 (×2): 4 mg via INTRAVENOUS
  Filled 2022-11-18 (×2): qty 2

## 2022-11-18 MED ORDER — CIPROFLOXACIN IN D5W 400 MG/200ML IV SOLN
400.0000 mg | Freq: Once | INTRAVENOUS | Status: AC
  Administered 2022-11-18: 400 mg via INTRAVENOUS
  Filled 2022-11-18: qty 200

## 2022-11-18 MED ORDER — PROPOFOL 10 MG/ML IV BOLUS
INTRAVENOUS | Status: DC | PRN
  Administered 2022-11-18: 150 mg via INTRAVENOUS

## 2022-11-18 MED ORDER — ONDANSETRON HCL 4 MG/2ML IJ SOLN
4.0000 mg | Freq: Once | INTRAMUSCULAR | Status: AC
  Administered 2022-11-18: 4 mg via INTRAVENOUS
  Filled 2022-11-18: qty 2

## 2022-11-18 MED ORDER — BUPIVACAINE HCL (PF) 0.5 % IJ SOLN
INTRAMUSCULAR | Status: DC | PRN
  Administered 2022-11-18: 30 mL

## 2022-11-18 MED ORDER — PROPOFOL 10 MG/ML IV BOLUS
INTRAVENOUS | Status: AC
  Filled 2022-11-18: qty 20

## 2022-11-18 MED ORDER — LIDOCAINE HCL (PF) 2 % IJ SOLN
INTRAMUSCULAR | Status: AC
  Filled 2022-11-18: qty 5

## 2022-11-18 MED ORDER — PHENYLEPHRINE 80 MCG/ML (10ML) SYRINGE FOR IV PUSH (FOR BLOOD PRESSURE SUPPORT)
PREFILLED_SYRINGE | INTRAVENOUS | Status: AC
  Filled 2022-11-18: qty 10

## 2022-11-18 MED ORDER — CHLORHEXIDINE GLUCONATE 0.12 % MT SOLN
15.0000 mL | Freq: Once | OROMUCOSAL | Status: AC
  Administered 2022-11-18: 15 mL via OROMUCOSAL

## 2022-11-18 MED ORDER — PHENYLEPHRINE HCL-NACL 20-0.9 MG/250ML-% IV SOLN
INTRAVENOUS | Status: DC | PRN
  Administered 2022-11-18: 50 ug/min via INTRAVENOUS

## 2022-11-18 MED ORDER — VASOPRESSIN 20 UNIT/ML IV SOLN
INTRAVENOUS | Status: AC
  Filled 2022-11-18: qty 1

## 2022-11-18 MED ORDER — OXYCODONE HCL 5 MG PO TABS
5.0000 mg | ORAL_TABLET | ORAL | Status: DC | PRN
  Administered 2022-11-18 – 2022-11-21 (×13): 5 mg via ORAL
  Filled 2022-11-18 (×14): qty 1

## 2022-11-18 MED ORDER — STERILE WATER FOR IRRIGATION IR SOLN
Status: DC | PRN
  Administered 2022-11-18: 500 mL

## 2022-11-18 MED ORDER — DEXAMETHASONE SODIUM PHOSPHATE 10 MG/ML IJ SOLN
INTRAMUSCULAR | Status: AC
  Filled 2022-11-18: qty 1

## 2022-11-18 MED ORDER — IOHEXOL 300 MG/ML  SOLN
100.0000 mL | Freq: Once | INTRAMUSCULAR | Status: AC | PRN
  Administered 2022-11-18: 100 mL via INTRAVENOUS

## 2022-11-18 MED ORDER — EPHEDRINE SULFATE-NACL 50-0.9 MG/10ML-% IV SOSY
PREFILLED_SYRINGE | INTRAVENOUS | Status: DC | PRN
  Administered 2022-11-18: 5 mg via INTRAVENOUS

## 2022-11-18 MED ORDER — ROCURONIUM BROMIDE 10 MG/ML (PF) SYRINGE
PREFILLED_SYRINGE | INTRAVENOUS | Status: AC
  Filled 2022-11-18: qty 10

## 2022-11-18 MED ORDER — SENNOSIDES-DOCUSATE SODIUM 8.6-50 MG PO TABS
1.0000 | ORAL_TABLET | Freq: Two times a day (BID) | ORAL | Status: DC
  Administered 2022-11-18 – 2022-11-22 (×8): 1 via ORAL
  Filled 2022-11-18 (×8): qty 1

## 2022-11-18 MED ORDER — PANTOPRAZOLE SODIUM 40 MG IV SOLR
40.0000 mg | Freq: Once | INTRAVENOUS | Status: AC
  Administered 2022-11-18: 40 mg via INTRAVENOUS
  Filled 2022-11-18: qty 10

## 2022-11-18 MED ORDER — BUPIVACAINE HCL (PF) 0.5 % IJ SOLN
INTRAMUSCULAR | Status: AC
  Filled 2022-11-18: qty 30

## 2022-11-18 MED ORDER — ONDANSETRON HCL 4 MG PO TABS: 4.0000 mg | ORAL_TABLET | Freq: Four times a day (QID) | ORAL | Status: DC | PRN

## 2022-11-18 MED ORDER — HYDROMORPHONE HCL 1 MG/ML IJ SOLN
0.5000 mg | INTRAMUSCULAR | Status: DC | PRN
  Administered 2022-11-18 – 2022-11-19 (×7): 0.5 mg via INTRAVENOUS
  Filled 2022-11-18 (×8): qty 0.5

## 2022-11-18 MED ORDER — HYDRALAZINE HCL 20 MG/ML IJ SOLN: 10.0000 mg | INTRAMUSCULAR | Status: DC | PRN

## 2022-11-18 MED ORDER — HYDROMORPHONE HCL 1 MG/ML IJ SOLN
0.2500 mg | INTRAMUSCULAR | Status: DC | PRN
  Administered 2022-11-18: 0.5 mg via INTRAVENOUS
  Filled 2022-11-18: qty 0.5

## 2022-11-18 MED ORDER — HYDROMORPHONE HCL 1 MG/ML IJ SOLN
1.0000 mg | Freq: Once | INTRAMUSCULAR | Status: AC
  Administered 2022-11-18: 1 mg via INTRAVENOUS
  Filled 2022-11-18: qty 1

## 2022-11-18 MED ORDER — SODIUM CHLORIDE 0.9 % IV BOLUS
1000.0000 mL | Freq: Once | INTRAVENOUS | Status: AC
  Administered 2022-11-18: 1000 mL via INTRAVENOUS

## 2022-11-18 MED ORDER — PHENYLEPHRINE 80 MCG/ML (10ML) SYRINGE FOR IV PUSH (FOR BLOOD PRESSURE SUPPORT)
PREFILLED_SYRINGE | INTRAVENOUS | Status: DC | PRN
  Administered 2022-11-18 (×5): 160 ug via INTRAVENOUS

## 2022-11-18 MED ORDER — OXYCODONE HCL 5 MG PO TABS: 5.0000 mg | ORAL_TABLET | Freq: Once | ORAL | Status: DC | PRN

## 2022-11-18 MED ORDER — FENTANYL CITRATE PF 50 MCG/ML IJ SOSY
PREFILLED_SYRINGE | INTRAMUSCULAR | Status: AC
  Filled 2022-11-18: qty 1

## 2022-11-18 MED ORDER — CIPROFLOXACIN IN D5W 400 MG/200ML IV SOLN
400.0000 mg | Freq: Two times a day (BID) | INTRAVENOUS | Status: DC
  Administered 2022-11-18 – 2022-11-22 (×8): 400 mg via INTRAVENOUS
  Filled 2022-11-18 (×8): qty 200

## 2022-11-18 MED ORDER — LACTATED RINGERS IV SOLN: INTRAVENOUS | Status: DC

## 2022-11-18 MED ORDER — SUCCINYLCHOLINE CHLORIDE 200 MG/10ML IV SOSY
PREFILLED_SYRINGE | INTRAVENOUS | Status: AC
  Filled 2022-11-18: qty 10

## 2022-11-18 MED ORDER — PROPOFOL 500 MG/50ML IV EMUL
INTRAVENOUS | Status: DC | PRN
  Administered 2022-11-18: 25 ug/kg/min via INTRAVENOUS

## 2022-11-18 MED ORDER — ONDANSETRON HCL 4 MG/2ML IJ SOLN
INTRAMUSCULAR | Status: DC | PRN
  Administered 2022-11-18: 4 mg via INTRAVENOUS

## 2022-11-18 MED ORDER — ONDANSETRON HCL 4 MG/2ML IJ SOLN: 4.0000 mg | Freq: Once | INTRAMUSCULAR | Status: DC | PRN

## 2022-11-18 MED ORDER — MIDAZOLAM HCL 2 MG/2ML IJ SOLN
INTRAMUSCULAR | Status: AC
  Filled 2022-11-18: qty 2

## 2022-11-18 MED ORDER — METRONIDAZOLE 500 MG/100ML IV SOLN
500.0000 mg | Freq: Once | INTRAVENOUS | Status: AC
  Administered 2022-11-18: 500 mg via INTRAVENOUS
  Filled 2022-11-18: qty 100

## 2022-11-18 MED ORDER — SODIUM CHLORIDE 0.9 % IR SOLN
Status: DC | PRN
  Administered 2022-11-18: 3000 mL

## 2022-11-18 MED ORDER — BISACODYL 10 MG RE SUPP
10.0000 mg | Freq: Every day | RECTAL | Status: DC
  Administered 2022-11-19 – 2022-11-21 (×3): 10 mg via RECTAL
  Filled 2022-11-18 (×3): qty 1

## 2022-11-18 MED ORDER — MORPHINE SULFATE (PF) 4 MG/ML IV SOLN
4.0000 mg | Freq: Once | INTRAVENOUS | Status: AC
  Administered 2022-11-18: 4 mg via INTRAVENOUS
  Filled 2022-11-18: qty 1

## 2022-11-18 MED ORDER — ACETAMINOPHEN 500 MG PO TABS
1000.0000 mg | ORAL_TABLET | Freq: Four times a day (QID) | ORAL | Status: DC
  Administered 2022-11-18 – 2022-11-22 (×14): 1000 mg via ORAL
  Filled 2022-11-18 (×15): qty 2

## 2022-11-18 MED ORDER — SUGAMMADEX SODIUM 200 MG/2ML IV SOLN
INTRAVENOUS | Status: DC | PRN
  Administered 2022-11-18: 200 mg via INTRAVENOUS

## 2022-11-18 MED ORDER — OXYCODONE HCL 5 MG/5ML PO SOLN: 5.0000 mg | Freq: Once | ORAL | Status: DC | PRN

## 2022-11-18 MED ORDER — ORAL CARE MOUTH RINSE: 15.0000 mL | Freq: Once | OROMUCOSAL | Status: AC

## 2022-11-18 MED ORDER — METRONIDAZOLE 500 MG/100ML IV SOLN
500.0000 mg | Freq: Two times a day (BID) | INTRAVENOUS | Status: DC
  Administered 2022-11-18 – 2022-11-22 (×7): 500 mg via INTRAVENOUS
  Filled 2022-11-18 (×8): qty 100

## 2022-11-18 MED ORDER — DEXAMETHASONE SODIUM PHOSPHATE 10 MG/ML IJ SOLN
INTRAMUSCULAR | Status: DC | PRN
  Administered 2022-11-18: 5 mg via INTRAVENOUS

## 2022-11-18 SURGICAL SUPPLY — 60 items
ADH SKN CLS APL DERMABOND .7 (GAUZE/BANDAGES/DRESSINGS) ×2
APL PRP STRL LF DISP 70% ISPRP (MISCELLANEOUS) ×2
CANNULA REDUCER 12-8 DVNC XI (CANNULA) ×2 IMPLANT
CHLORAPREP W/TINT 26 (MISCELLANEOUS) ×2 IMPLANT
COVER MAYO STAND XLG (MISCELLANEOUS) ×2 IMPLANT
COVER TIP SHEARS 8 DVNC (MISCELLANEOUS) IMPLANT
DEFOGGER SCOPE WARMER CLEARIFY (MISCELLANEOUS) ×2 IMPLANT
DERMABOND ADVANCED .7 DNX12 (GAUZE/BANDAGES/DRESSINGS) ×2 IMPLANT
DRAPE ARM DVNC X/XI (DISPOSABLE) ×6 IMPLANT
DRAPE COLUMN DVNC XI (DISPOSABLE) ×2 IMPLANT
DRSG TEGADERM 2-3/8X2-3/4 SM (GAUZE/BANDAGES/DRESSINGS) IMPLANT
EVACUATOR DRAINAGE 10X20 100CC (DRAIN) IMPLANT
EVACUATOR SILICONE 100CC (DRAIN) ×2
FORCEPS BPLR 8 MD DVNC XI (FORCEP) IMPLANT
FORCEPS BPLR R/ABLATION 8 DVNC (INSTRUMENTS) ×2 IMPLANT
GAUZE SPONGE 2X2 8PLY STRL LF (GAUZE/BANDAGES/DRESSINGS) IMPLANT
GLOVE BIOGEL PI IND STRL 6.5 (GLOVE) ×4 IMPLANT
GLOVE SURG SS PI 6.5 STRL IVOR (GLOVE) ×4 IMPLANT
GOWN STRL REUS W/TWL LRG LVL3 (GOWN DISPOSABLE) ×6 IMPLANT
GRASPER SUT TROCAR 14GX15 (MISCELLANEOUS) ×2 IMPLANT
IRRIGATOR SUCT 8 DISP DVNC XI (IRRIGATION / IRRIGATOR) IMPLANT
IV NS IRRIG 3000ML ARTHROMATIC (IV SOLUTION) IMPLANT
KIT PINK PAD W/HEAD ARE REST (MISCELLANEOUS) ×2 IMPLANT
KIT PINK PAD W/HEAD ARM REST (MISCELLANEOUS) ×2 IMPLANT
KIT TURNOVER KIT A (KITS) ×2 IMPLANT
MANIFOLD NEPTUNE II (INSTRUMENTS) ×2 IMPLANT
NDL HYPO 21X1.5 SAFETY (NEEDLE) ×2 IMPLANT
NDL INSUFFLATION 14GA 120MM (NEEDLE) ×2 IMPLANT
NEEDLE HYPO 21X1.5 SAFETY (NEEDLE) ×2 IMPLANT
NEEDLE INSUFFLATION 14GA 120MM (NEEDLE) ×2 IMPLANT
OBTURATOR OPTICAL STND 8 DVNC (TROCAR) ×2
OBTURATOR OPTICALSTD 8 DVNC (TROCAR) ×2 IMPLANT
PACK LAP CHOLE LZT030E (CUSTOM PROCEDURE TRAY) ×2 IMPLANT
PENCIL HANDSWITCHING (ELECTRODE) ×2 IMPLANT
POSITIONER HEAD 8X9X4 ADT (SOFTGOODS) ×2 IMPLANT
RELOAD STAPLE 45 2.5 WHT DVNC (STAPLE) IMPLANT
RELOAD STAPLE 45 3.5 BLU DVNC (STAPLE) IMPLANT
RELOAD STAPLER 2.5X45 WHT DVNC (STAPLE) IMPLANT
RELOAD STAPLER 3.5X45 BLU DVNC (STAPLE) ×2 IMPLANT
SCISSORS MNPLR CVD DVNC XI (INSTRUMENTS) IMPLANT
SEAL CANN UNIV 5-8 DVNC XI (MISCELLANEOUS) ×6 IMPLANT
SEALER VESSEL EXT DVNC XI (MISCELLANEOUS) ×2 IMPLANT
SET TUBE SMOKE EVAC HIGH FLOW (TUBING) ×2 IMPLANT
SPONGE DRAIN TRACH 4X4 STRL 2S (GAUZE/BANDAGES/DRESSINGS) IMPLANT
STAPLER 45 SUREFORM DVNC (STAPLE) ×2 IMPLANT
STAPLER CANNULA SEAL DVNC XI (STAPLE) ×2 IMPLANT
STAPLER RELOAD 2.5X45 WHT DVNC (STAPLE)
STAPLER RELOAD 3.5X45 BLU DVNC (STAPLE) ×2
SUT ETHILON 3 0 FSL (SUTURE) IMPLANT
SUT MNCRL AB 4-0 PS2 18 (SUTURE) ×2 IMPLANT
SUT SILK 2 0 (SUTURE) ×2
SUT SILK 2-0 18XBRD TIE 12 (SUTURE) ×2 IMPLANT
SUT VICRYL 0 AB UR-6 (SUTURE) ×2 IMPLANT
SYR 30ML LL (SYRINGE) ×2 IMPLANT
SYS BAG RETRIEVAL 10MM (BASKET) ×2
SYSTEM BAG RETRIEVAL 10MM (BASKET) ×2 IMPLANT
TAPE TRANSPORE STRL 2 31045 (GAUZE/BANDAGES/DRESSINGS) ×2 IMPLANT
TRAY FOLEY W/BAG SLVR 16FR (SET/KITS/TRAYS/PACK) ×2
TRAY FOLEY W/BAG SLVR 16FR ST (SET/KITS/TRAYS/PACK) ×2 IMPLANT
WATER STERILE IRR 500ML POUR (IV SOLUTION) ×2 IMPLANT

## 2022-11-18 NOTE — Consult Note (Signed)
Reason for Consult:Acute Appendicitis with pelvic fluid Referring Physician: Dr. Meridee Score  Lilyan Kolacz is an 68 y.o. female.  HPI: Mabelene Deboe is a 67 yo female with a PMHx of hiatal hernia, GERD, hypothyroidism and hypertension admitted for acute appendicitis. Yesterday she experienced worsening RLQ pain that increased overnight. She reported initially feeling like it was her usual diffuse abdominal pain, but as it worsened she thought "it might be gas". She tried GasX, suppositories and milk of magnesia with no relief. She reported nausea and vomiting. She denied diarrhea.   Past Medical History:  Diagnosis Date   Allergy to alpha-gal    Constipation    Fibromyalgia    GERD (gastroesophageal reflux disease)    Hayfever    Hyperlipidemia    Hypothyroidism     Past Surgical History:  Procedure Laterality Date   BALLOON DILATION N/A 03/11/2021   Procedure: BALLOON DILATION;  Surgeon: Lanelle Bal, DO;  [facility];  Service: Endoscopy;  Laterality: N/A;   BALLOON DILATION N/A 02/02/2022   Procedure: BALLOON DILATION;  Surgeon: Lanelle Bal, DO;  [facility];  Service: Endoscopy;  Laterality: N/A;   BIOPSY  03/11/2021   Procedure: BIOPSY;  Surgeon: Lanelle Bal, DO;  [facility];  Service: Endoscopy;;   BIOPSY  02/02/2022   Procedure: BIOPSY;  Surgeon: Lanelle Bal, DO;  [facility];  Service: Endoscopy;;  gastric   CARPAL TUNNEL RELEASE Bilateral    CATARACT EXTRACTION Left    CESAREAN SECTION     x3   COLONOSCOPY WITH PROPOFOL N/A 09/09/2020   Surgeon: Lanelle Bal, DO;  Nonbleeding internal hemorrhoids, diverticulosis in the sigmoid and descending colon.  Repeat in 10 years.   ESOPHAGOGASTRODUODENOSCOPY (EGD) WITH PROPOFOL N/A 03/11/2021   Surgeon: Lanelle Bal, DO; small hiatal hernia, grade C reflux esophagitis, benign-appearing esophageal stenosis dilated, gastritis biopsied. Pathology with  reactive gastropathy with intestinal metaplasia, negative for H. pylori.   ESOPHAGOGASTRODUODENOSCOPY (EGD) WITH PROPOFOL N/A 02/02/2022   Procedure: ESOPHAGOGASTRODUODENOSCOPY (EGD) WITH PROPOFOL;  Surgeon: Lanelle Bal, DO;  [facility];  Service: Endoscopy;  Laterality: N/A;  8:45am   SINUS EXPLORATION     TONSILLECTOMY      Family History  Problem Relation Age of Onset   Cancer Mother    Hypertension Mother    Multiple sclerosis Father    Esophageal cancer Maternal Aunt    Colon cancer Paternal Grandmother        diagnosed in her 21s    Social History:  reports that she has quit smoking. Her smoking use included cigarettes. She has a 6.50 pack-year smoking history. She has never used smokeless tobacco. She reports current alcohol use. She reports that she does not use drugs.  Allergies:  Allergies  Allergen Reactions   Alpha-Gal Anaphylaxis and Swelling   Cephalosporins Hives   Clindamycin Hives   Codeine Itching   Doxycycline Hives   Sulfa Antibiotics     Redness     Medications: I have reviewed the patient's current medications.  Results for orders placed or performed during the hospital encounter of 11/18/22 (from the past 48 hour(s))  Lipase, blood     Status: None   Collection Time: 11/18/22  7:00 AM  Result Value Ref Range   Lipase 29 11 - 51 U/L    Comment: Performed at [facility], [address], [place], [place] [address]  Comprehensive metabolic panel  Status: Abnormal   Collection Time: 11/18/22  7:00 AM  Result Value Ref Range   Sodium 133 (L) 135 - 145 mmol/L   Potassium 3.8 3.5 - 5.1 mmol/L   Chloride 98 98 - 111 mmol/L   CO2 25 22 - 32 mmol/L   Glucose, Bld 151 (H) 70 - 99 mg/dL    Comment: Glucose reference range applies only to samples taken after fasting for at least 8 hours.   BUN 25 (H) 8 - 23 mg/dL   Creatinine, Ser 1.61 0.44 - 1.00 mg/dL   Calcium 9.0 8.9 - 09.6 mg/dL   Total Protein 7.1 6.5 - 8.1 g/dL   Albumin 4.0 3.5  - 5.0 g/dL   AST 24 15 - 41 U/L   ALT 21 0 - 44 U/L   Alkaline Phosphatase 51 38 - 126 U/L   Total Bilirubin 1.2 0.3 - 1.2 mg/dL   GFR, Estimated >04 >54 mL/min    Comment: (NOTE) Calculated using the CKD-EPI Creatinine Equation (2021)    Anion gap 10 5 - 15    Comment: Performed at [facility], [address], [place], [place] [address]  CBC     Status: None   Collection Time: 11/18/22  7:00 AM  Result Value Ref Range   WBC 9.0 4.0 - 10.5 K/uL   RBC 4.24 3.87 - 5.11 MIL/uL   Hemoglobin 14.4 12.0 - 15.0 g/dL   HCT 91.4 78.2 - 95.6 %   MCV 98.1 80.0 - 100.0 fL   MCH 34.0 26.0 - 34.0 pg   MCHC 34.6 30.0 - 36.0 g/dL   RDW 21.3 08.6 - 57.8 %   Platelets 262 150 - 400 K/uL   nRBC 0.0 0.0 - 0.2 %    Comment: Performed at [facility], [address] [place], [place] [address]  Urinalysis, Routine w reflex microscopic -Urine, Clean Catch     Status: None   Collection Time: 11/18/22  9:34 AM  Result Value Ref Range   Color, Urine YELLOW YELLOW   APPearance CLEAR CLEAR   Specific Gravity, Urine 1.005 1.005 - 1.030   pH 6.5 5.0 - 8.0   Glucose, UA NEGATIVE NEGATIVE mg/dL   Hgb urine dipstick NEGATIVE NEGATIVE   Bilirubin Urine NEGATIVE NEGATIVE   Ketones, ur NEGATIVE NEGATIVE mg/dL   Protein, ur NEGATIVE NEGATIVE mg/dL   Nitrite NEGATIVE NEGATIVE   Leukocytes,Ua NEGATIVE NEGATIVE    Comment: Microscopic not done on urines with negative protein, blood, leukocytes, nitrite, or glucose < 500 mg/dL. Performed at [facility], [address], [place], [place] [address]     CT ABDOMEN PELVIS W CONTRAST  Result Date: 11/18/2022 CLINICAL DATA:  Right-sided abdominal pain for 1 day. EXAM: CT ABDOMEN AND PELVIS WITH CONTRAST TECHNIQUE: Multidetector CT imaging of the abdomen and pelvis was performed using the standard protocol following bolus administration of intravenous contrast. RADIATION DOSE REDUCTION: This exam was performed according to the departmental dose-optimization program which  includes automated exposure control, adjustment of the mA and/or kV according to patient size and/or use of iterative reconstruction technique. CONTRAST:  OMNIPAQUE IOHEXOL 300 MG/ML  SOLN COMPARISON:  None Available. FINDINGS: Lower chest: Hyperdense material which appears to be within the left lower lobe vasculature may reflect embolized cement material. The imaged lung bases are otherwise unremarkable. The imaged heart is unremarkable. Hepatobiliary: The liver and gallbladder are unremarkable. There is no biliary ductal dilatation. Pancreas: Unremarkable. Spleen: Unremarkable. Adrenals/Urinary Tract: The adrenals are unremarkable. The kidneys are  unremarkable, with no focal lesion, stone, hydronephrosis, or hydroureter. There is symmetric excretion of contrast into the collecting systems on the delayed images. The bladder is unremarkable. Stomach/Bowel: There is a moderate-sized hiatal hernia with fluid in the distal esophagus likely reflecting reflux. The stomach is otherwise unremarkable. There is no evidence of bowel obstruction. There is background colonic diverticulosis. The appendix is dilated and inflamed measuring up to 1.2 cm in diameter with multiple appendicoliths at the base. There is wall thickening in the adjacent distal ileum as well as the: Which is favored to reflect reactive inflammatory change. There is surrounding free fluid in the right lower quadrant, left paracolic gutter, and pelvis but no evidence of organized or drainable abscess. There is no free intraperitoneal air to suggest frank perforation. Vascular/Lymphatic: There is calcified plaque in the nonaneurysmal abdominal aorta. The major branch vessels are patent. The main portal and splenic veins are patent. There is no abdominal or pelvic lymphadenopathy. Reproductive: The uterus and adnexa are unremarkable. Other: There is a small fat containing umbilical hernia. Musculoskeletal: There is no acute osseous abnormality or  suspicious osseous lesion. The patient is status post vertebral augmentation at T7, L1, and L5. There are age-indeterminate compression fractures of the T11, T12, and L2 through L4 vertebral bodies. There is no bony retropulsion at any level. IMPRESSION: 1. Acute appendicitis with surrounding free fluid but no evidence of organized or drainable abscess or free intraperitoneal air. Multiple appendicoliths are noted at the base of the appendix. 2. Probable reactive terminal ileitis and colitis. 3. Moderate-sized hiatal hernia with fluid in the distal esophagus likely reflecting reflux. 4. Diverticulosis. 5. Status post vertebral augmentation at T7, L1, and L5 with a probable small amount of embolized cement material in the left lower lobe. 6. Age-indeterminate compression fractures of the T11, T12, and L2 through L4 vertebral bodies. Electronically Signed   By: Lesia Hausen M.D.   On: 11/18/2022 08:57    ROS:  Pertinent items are noted in HPI.  Blood pressure 106/62, pulse 84, temperature 99.8 F (37.7 C), temperature source Oral, resp. rate (!) 21, weight 70.3 kg, SpO2 90 %. Physical Exam:   General: Drowsy from Dilaudid. Able to carry a conversation. Intermittently nodded off. HEENT: Normocephalic atraumatic. Dry mucous membranes.  Respiratory: Normal work of breathing  Gastrointestinal: Tender to percussion. Diffuse abdominal tenderness with palpation with increased pain in the RLQ. No guarding or rebound tenderness.    Assessment/Plan: Logen Babiak  is a 67 yo female admitted for acute appendicitis. She has been afebrile with stable vital signs and no leukocytosis.  Acute Appendicitis: -Robotic assisted appendectomy  -IV ciprofloxacin and metronidazole  -IV pain control with hydromorphone and morphine  -Admit for pain control post-op   Camille Bal 11/18/2022, 10:54 AM

## 2022-11-18 NOTE — ED Triage Notes (Signed)
Severe abd pain that started yesterday. Pt thought pain was from gas, tried milk of magnesia and gas-x and multiple suppositories. Now pain is 10/10 such that pt had to be assisted out of vehicle, unable to sit still.  Positive bilie emesis this AM.   Pain started RLQ, now generalized.   H/o hiatal hernia

## 2022-11-18 NOTE — Op Note (Addendum)
[facility] Operative note  Preoperative diagnosis: Acute appendicitis  Postoperative diagnosis: Acute perforated appendicitis  Procedure: Robotic assisted laparoscopic appendectomy, partial omentectomy, lysis of adhesions  Anesthesia: General   Surgeon: Theophilus Kinds, DO  Wound Classification: Dirty/infected  Specimen: Appendix, partial omentum  Complications: None  Estimated Blood Loss: 10 cc  Indications: Patient is a 67 y.o. female  presented with a 1 day history of worsening right lower quadrant abdominal pain.  She underwent a CT of the abdomen and pelvis which demonstrated acute appendicitis with surrounding free fluid. The risk of surgery were explained to the patient including but not limited to bleeding, infection, finding a rupture, injury to other organs, needing to do an open procedure.   FIndings: Perforated appendicitis with feculent drainage from the body of the appendix Turbid fluid throughout abdomen Upon entering the abdomen (organ space), I encountered a phlegmon involving the appendix with associated perforation .  Description of procedure: The patient was placed on the operating table in the supine position, left arm tucked. General anesthesia was induced. A time-out was completed verifying correct patient, procedure, site, positioning, and implant(s) and/or special equipment prior to beginning this procedure. The abdomen was prepped and draped in the usual sterile fashion.   In Palmer's point, an incision was made and Veress needle was inserted.  After confirming intraabdominal location with positive saline drop test and low insufflation pressures, gas insufflation was initiated until the abdominal pressure was measured at 15 mmHg.  Afterwards, the Veress needle was removed and a 8 mm port was placed through Palmer's point site using Optiview technique. No injuries were noted. Two additional incision was made 8 cm apart each side along  the left side of the abdominal wall from the initial incision.  An 8 mm port was placed under direct visualization.  The Palmer's point port was then upsized to a 12 mm port.  An additional 8 mm port was then placed in the left lower quadrant.  No injuries from trocar placements were noted. The table was placed in the Trendelenburg position with the right side elevated.  Xi robotic platform was then brought to the operative field and docked. A vessel sealer was placed through the 12 mm port and a forced bipolar through the lower 8 mm port.   Upon entering the abdomen (organ space), I encountered a phlegmon involving the appendix with associated perforation . An appendix that appeared significantly inflamed was identified and elevated.  Infection was present within the abdominal cavity due to appendicitis with perforation.  There was feculent drainage noted from the perforation site of the appendix.  The omentum was significantly adhered overlying the appendix.  This was dissected off.  A window was created at base of appendix in the mesentery.  A blue load linear cutting stapler was placed through the 12 mm port, and then used to divide and staple the base of the appendix. The vessel sealer was used to ligate the mesoappendix. No bleeding from the staple lines noted.  The omentum that was overlying the appendix was removed using vessel sealer, as it was significantly inflamed.  The appendix and part of the omentum were placed in an endoscopic retrieval bag and removed through the 12 mm port.   The appendiceal stump and mesoappendix staple line examined again and hemostasis noted. No other pathology was identified within pelvis.  There was turbid fluid in the abdomen and in the pelvis.  The sigmoid colon was noted to be adherent to the anterior  abdominal wall.  This was taken down with LigaSure.  The pelvis was copiously irrigated and suctioned until the fluid returned clear.  Given the perforation with feculent  drainage, decision was made to leave a JP drain in the right paracolic gutter/pelvis.  The JP drain was sutured into place with 3-0 nylon.  The 12 mm trocar removed and port site closed with PMI using 0 vicryl under direct vision. Remaining trocars were removed. No bleeding was noted.  The abdomen was allowed to collapse.  Given the significant inflammation and infection inside the abdomen, decision was made to close laparoscopic sites with skin staples.  Wounds then dressed with 4 x 4's and Medipore tape.  The patient tolerated the procedure well, awakened from anesthesia and was taken to the postanesthesia care unit in satisfactory condition.  Sponge count and instrument count correct at the end of the procedure.  Theophilus Kinds, DO [facility] [address] [place], [place] [address] [phone] (office)

## 2022-11-18 NOTE — Progress Notes (Signed)
[facility]  Spoke with the patient's husband in the consultation room.  I explained that her appendix was perforated, but I was able to fully remove the appendix.  Given the significant inflammation and perforation noted, I did decide to leave a JP drain in place.  She has skin staples at her incision sites.  She will stay at least tonight and we will see how she is doing tomorrow.  I suspect that she will have a postoperative ileus given the significant inflammation within her abdomen.  Will continue IV antibiotics, pain medications, and full liquid diet ordered.  All questions were answered to his expressed satisfaction.  Plan: -Transfer patient up to the floor -Continue IV ciprofloxacin and Flagyl -Scheduled Tylenol, as needed oxycodone and Dilaudid -Full liquid diet, will potentially advance tomorrow depending on how patient does -Bowel regimen ordered with Senokot-S and Dulcolax suppositories -AM labs -Appreciate hospitalist recommendations -Disposition to be determined based on how patient progresses  Theophilus Kinds, DO [facility] [address] [place], [place] [address] [phone] (office)

## 2022-11-18 NOTE — ED Notes (Signed)
Patient transported to CT 

## 2022-11-18 NOTE — ED Notes (Signed)
Jewerly off and given to husband.

## 2022-11-18 NOTE — ED Provider Notes (Signed)
[place] EMERGENCY DEPARTMENT AT [facility] Provider Note   CSN: [id number] Arrival date & time: 11/18/22  5366     History  Chief Complaint  Patient presents with   Abdominal Pain    Carolyn Welch is a 67 y.o. female.  She is here with a complaint of generalized abdominal pain that started yesterday.  She has chronic hiatal hernia with chronic upper abdominal pain, follows with Dr. Marletta Lor.  Upper abdominal pain worsened yesterday feels very gassy.  Took Gas-X took milk of mag had a bowel movement without any improvement.  Nausea, vomit x 1 today.  No fever no chest pain or shortness of breath.  Rates the pain as severe.  Prior surgical history of C-sections x 3.  The history is provided by the patient and the spouse.  Abdominal Pain Pain location:  Generalized Pain quality: aching and cramping   Pain severity:  Severe Onset quality:  Gradual Duration:  2 days Timing:  Constant Progression:  Worsening Chronicity:  New Context: not trauma   Relieved by:  Nothing Worsened by:  Nothing Ineffective treatments:  OTC medications Associated symptoms: flatus, nausea and vomiting   Associated symptoms: no chest pain, no constipation, no cough, no diarrhea, no dysuria, no fever, no hematemesis, no melena and no shortness of breath        Home Medications Prior to Admission medications   Medication Sig Start Date End Date Taking? Authorizing Provider  acetaminophen (TYLENOL) 650 MG CR tablet Take 650-1,300 mg by mouth every 8 (eight) hours as needed for pain.    [provider]  Alum Hydroxide-Mag Trisilicate (GAVISCON) 80-14.2 MG CHEW Chew 2 tablets by mouth daily as needed (indigestion).    [provider]  calcium carbonate (TUMS EX) 750 MG chewable tablet Chew 1 tablet (750 mg total) by mouth 2 (two) times daily. As needed for heartburn Patient taking differently: Chew 2 tablets by mouth as needed. As needed for heartburn 02/02/22   Lanelle Bal,  DO  cyclobenzaprine (FLEXERIL) 10 MG tablet Take 10 mg by mouth 2 (two) times daily as needed for muscle spasms.    [provider]  diphenhydrAMINE (BENADRYL) 25 MG tablet Take 50 mg by mouth every 6 (six) hours as needed for allergies.    [provider]  EPINEPHrine 0.3 mg/0.3 mL IJ SOAJ injection Inject 0.3 mg into the muscle as needed for anaphylaxis. 09/26/20   [provider]  hydrocortisone 2.5 % cream Apply 1 Application topically 2 (two) times daily as needed (rosacea). 10/28/21   [provider]  ibuprofen (ADVIL) 200 MG tablet Take 200 mg by mouth every 6 (six) hours as needed for moderate pain.    [provider]  levothyroxine (SYNTHROID) 100 MCG tablet Take 100 mcg by mouth daily before breakfast.    [provider]  loratadine (CLARITIN) 10 MG tablet Take 10 mg by mouth daily.    [provider]  mupirocin ointment (BACTROBAN) 2 % Apply 1 Application topically 3 (three) times daily as needed (rosacea). 10/28/21   [provider]  omeprazole (PRILOSEC OTC) 20 MG tablet Take 1 tablet (20 mg total) by mouth 2 (two) times daily. Patient taking differently: Take 20 mg by mouth daily. 02/02/22 02/02/23  Lanelle Bal, DO  spironolactone (ALDACTONE) 100 MG tablet Take 100 mg by mouth 2 (two) times daily. 06/26/20   [provider]  traZODone (DESYREL) 100 MG tablet Take 100 mg by mouth at bedtime.  [provider]  VALTREX 500 MG tablet Take 500 mg by mouth 2 (two) times daily. 02/18/21   [provider]      Allergies    Alpha-gal, Cephalosporins, Clindamycin, Codeine, Doxycycline, and Sulfa antibiotics    Review of Systems   Review of Systems  Constitutional:  Negative for fever.  Respiratory:  Negative for cough and shortness of breath.   Cardiovascular:  Negative for chest pain.  Gastrointestinal:  Positive for abdominal pain, flatus, nausea and vomiting. Negative for constipation,  diarrhea, hematemesis and melena.  Genitourinary:  Negative for dysuria.    Physical Exam Updated Vital Signs BP 137/83   Pulse 74   Temp 98.6 F (37 C)   Resp (!) 21   Wt 70.3 kg   SpO2 97%   BMI 29.29 kg/m  Physical Exam Vitals and nursing note reviewed.  Constitutional:      General: She is not in acute distress.    Appearance: Normal appearance. She is well-developed.  HENT:     Head: Normocephalic and atraumatic.  Eyes:     Conjunctiva/sclera: Conjunctivae normal.  Cardiovascular:     Rate and Rhythm: Normal rate and regular rhythm.     Heart sounds: No murmur heard. Pulmonary:     Effort: Pulmonary effort is normal. No respiratory distress.     Breath sounds: Normal breath sounds.  Abdominal:     Palpations: Abdomen is soft.     Tenderness: There is generalized abdominal tenderness. There is no guarding or rebound.  Musculoskeletal:        General: No deformity. Normal range of motion.     Cervical back: Neck supple.     Right lower leg: No edema.     Left lower leg: No edema.  Skin:    General: Skin is warm and dry.     Capillary Refill: Capillary refill takes less than 2 seconds.  Neurological:     General: No focal deficit present.     Mental Status: She is alert.     ED Results / Procedures / Treatments   Labs (all labs ordered are listed, but only abnormal results are displayed) Labs Reviewed  COMPREHENSIVE METABOLIC PANEL - Abnormal; Notable for the following components:      Result Value   Sodium 133 (*)    Glucose, Bld 151 (*)    BUN 25 (*)    All other components within normal limits  LIPASE, BLOOD  CBC  URINALYSIS, ROUTINE W REFLEX MICROSCOPIC  HIV ANTIBODY (ROUTINE TESTING W REFLEX)  BASIC METABOLIC PANEL  CBC  SURGICAL PATHOLOGY    EKG None  Radiology CT ABDOMEN PELVIS W CONTRAST  Result Date: 11/18/2022 CLINICAL DATA:  Right-sided abdominal pain for 1 day. EXAM: CT ABDOMEN AND PELVIS WITH CONTRAST TECHNIQUE: Multidetector CT  imaging of the abdomen and pelvis was performed using the standard protocol following bolus administration of intravenous contrast. RADIATION DOSE REDUCTION: This exam was performed according to the departmental dose-optimization program which includes automated exposure control, adjustment of the mA and/or kV according to patient size and/or use of iterative reconstruction technique. CONTRAST:  OMNIPAQUE IOHEXOL 300 MG/ML  SOLN COMPARISON:  None Available. FINDINGS: Lower chest: Hyperdense material which appears to be within the left lower lobe vasculature may reflect embolized cement material. The imaged lung bases are otherwise unremarkable. The imaged heart is unremarkable. Hepatobiliary: The liver and gallbladder are unremarkable. There is no biliary ductal dilatation. Pancreas: Unremarkable. Spleen: Unremarkable. Adrenals/Urinary Tract: The adrenals are  unremarkable. The kidneys are unremarkable, with no focal lesion, stone, hydronephrosis, or hydroureter. There is symmetric excretion of contrast into the collecting systems on the delayed images. The bladder is unremarkable. Stomach/Bowel: There is a moderate-sized hiatal hernia with fluid in the distal esophagus likely reflecting reflux. The stomach is otherwise unremarkable. There is no evidence of bowel obstruction. There is background colonic diverticulosis. The appendix is dilated and inflamed measuring up to 1.2 cm in diameter with multiple appendicoliths at the base. There is wall thickening in the adjacent distal ileum as well as the: Which is favored to reflect reactive inflammatory change. There is surrounding free fluid in the right lower quadrant, left paracolic gutter, and pelvis but no evidence of organized or drainable abscess. There is no free intraperitoneal air to suggest frank perforation. Vascular/Lymphatic: There is calcified plaque in the nonaneurysmal abdominal aorta. The major branch vessels are patent. The main portal and splenic  veins are patent. There is no abdominal or pelvic lymphadenopathy. Reproductive: The uterus and adnexa are unremarkable. Other: There is a small fat containing umbilical hernia. Musculoskeletal: There is no acute osseous abnormality or suspicious osseous lesion. The patient is status post vertebral augmentation at T7, L1, and L5. There are age-indeterminate compression fractures of the T11, T12, and L2 through L4 vertebral bodies. There is no bony retropulsion at any level. IMPRESSION: 1. Acute appendicitis with surrounding free fluid but no evidence of organized or drainable abscess or free intraperitoneal air. Multiple appendicoliths are noted at the base of the appendix. 2. Probable reactive terminal ileitis and colitis. 3. Moderate-sized hiatal hernia with fluid in the distal esophagus likely reflecting reflux. 4. Diverticulosis. 5. Status post vertebral augmentation at T7, L1, and L5 with a probable small amount of embolized cement material in the left lower lobe. 6. Age-indeterminate compression fractures of the T11, T12, and L2 through L4 vertebral bodies. Electronically Signed   By: Lesia Hausen M.D.   On: 11/18/2022 08:57    Procedures Procedures    Medications Ordered in ED Medications  Chlorhexidine Gluconate Cloth 2 % PADS 6 each (has no administration in time range)    And  Chlorhexidine Gluconate Cloth 2 % PADS 6 each (has no administration in time range)  sodium chloride 0.9 % bolus 1,000 mL ( Intravenous MAR Unhold 11/18/22 1537)  levothyroxine (SYNTHROID) tablet 100 mcg (has no administration in time range)  ondansetron (ZOFRAN) tablet 4 mg ( Oral See Alternative 11/18/22 1613)    Or  ondansetron (ZOFRAN) injection 4 mg (4 mg Intravenous Given 11/18/22 1613)  hydrALAZINE (APRESOLINE) injection 10 mg (has no administration in time range)  acetaminophen (TYLENOL) tablet 1,000 mg (1,000 mg Oral Given 11/18/22 1707)  ciprofloxacin (CIPRO) IVPB 400 mg (has no administration in time range)     And  metroNIDAZOLE (FLAGYL) IVPB 500 mg (has no administration in time range)  oxyCODONE (Oxy IR/ROXICODONE) immediate release tablet 5 mg (5 mg Oral Given 11/18/22 1614)  senna-docusate (Senokot-S) tablet 1 tablet (has no administration in time range)  bisacodyl (DULCOLAX) suppository 10 mg (has no administration in time range)  HYDROmorphone (DILAUDID) injection 0.5 mg (0.5 mg Intravenous Given 11/18/22 1706)  sodium chloride 0.9 % bolus 1,000 mL (0 mLs Intravenous Stopped 11/18/22 0858)  ondansetron (ZOFRAN) injection 4 mg (4 mg Intravenous Given 11/18/22 0702)  morphine (PF) 4 MG/ML injection 4 mg (4 mg Intravenous Given 11/18/22 0702)  pantoprazole (PROTONIX) injection 40 mg (40 mg Intravenous Given 11/18/22 0701)  HYDROmorphone (DILAUDID) injection 1 mg (1 mg  Intravenous Given 11/18/22 0734)  iohexol (OMNIPAQUE) 300 MG/ML solution 100 mL (100 mLs Intravenous Contrast Given 11/18/22 0816)  ciprofloxacin (CIPRO) IVPB 400 mg (0 mg Intravenous Stopped 11/18/22 1108)    And  metroNIDAZOLE (FLAGYL) IVPB 500 mg (0 mg Intravenous Stopped 11/18/22 1108)  0.9 %  sodium chloride infusion ( Intravenous New Bag/Given 11/18/22 0951)  HYDROmorphone (DILAUDID) injection 1 mg (1 mg Intravenous Given 11/18/22 1031)  0.9 %  sodium chloride infusion ( Intravenous New Bag/Given 11/18/22 1626)  chlorhexidine (PERIDEX) 0.12 % solution 15 mL (15 mLs Mouth/Throat Given 11/18/22 1151)    Or  Oral care mouth rinse ( Mouth Rinse See Alternative 11/18/22 1151)    ED Course/ Medical Decision Making/ A&P Clinical Course as of 11/18/22 1704  Wed Nov 18, 2022  0728 Patient given morphine without improvement in her symptoms.  Dilaudid ordered. [MB]  0912 CT showing acute appendicitis.  Have ordered antibiotics discussed with Dr. Robyne Peers.  She has that the patient be admitted to general medicine and she will consult. [MB]    Clinical Course User Index [MB] Terrilee Files, MD                             Medical Decision Making Amount  and/or Complexity of Data Reviewed Labs: ordered. Radiology: ordered.  Risk Prescription drug management. Decision regarding hospitalization.   This patient complains of generalized abdominal pain; this involves an extensive number of treatment Options and is a complaint that carries with it a high risk of complications and morbidity. The differential includes appendicitis, diverticulitis, obstruction, perforation  I ordered, reviewed and interpreted labs, which included CBC with normal white count, chemistries with mildly elevated glucose, urinalysis negative I ordered medication IV fluids IV antibiotics IV pain medication and reviewed PMP when indicated. I ordered imaging studies which included CT abdomen and pelvis and I independently    visualized and interpreted imaging which showed acute appendicitis with free fluid Additional history obtained from patient's husband Previous records obtained and reviewed in epic including prior GI notes I consulted general surgery Dr. Robyne Peers and [facility] hospitalist Dr. Jarvis Newcomer and discussed lab and imaging findings and discussed disposition.  Cardiac monitoring reviewed, normal sinus rhythm Social determinants considered, no significant barriers Critical Interventions: None  After the interventions stated above, I reevaluated the patient and found patient to still have significant pain although otherwise nontoxic-appearing Admission and further testing considered, she would benefit from mission the hospital for IV antibiotics and further general surgery evaluation.  She is agreeable to plan for admission.         Final Clinical Impression(s) / ED Diagnoses Final diagnoses:  Acute appendicitis    Rx / DC Orders ED Discharge Orders     None         Terrilee Files, MD 11/18/22 [phone]

## 2022-11-18 NOTE — Anesthesia Preprocedure Evaluation (Signed)
Anesthesia Evaluation  Patient identified by MRN, date of birth, ID band Patient awake    Reviewed: Allergy & Precautions, H&P , NPO status , Patient's Chart, lab work & pertinent test results, reviewed documented beta blocker date and time   Airway Mallampati: II  TM Distance: >3 FB Neck ROM: full    Dental no notable dental hx.    Pulmonary neg pulmonary ROS, former smoker   Pulmonary exam normal breath sounds clear to auscultation       Cardiovascular Exercise Tolerance: Good hypertension, negative cardio ROS  Rhythm:regular Rate:Normal     Neuro/Psych  Neuromuscular disease negative neurological ROS  negative psych ROS   GI/Hepatic negative GI ROS, Neg liver ROS, hiatal hernia,GERD  ,,  Endo/Other  negative endocrine ROSHypothyroidism    Renal/GU negative Renal ROS  negative genitourinary   Musculoskeletal   Abdominal   Peds  Hematology negative hematology ROS (+)   Anesthesia Other Findings   Reproductive/Obstetrics negative OB ROS                             Anesthesia Physical Anesthesia Plan  ASA: 2 and emergent  Anesthesia Plan: General and General ETT   Post-op Pain Management:    Induction:   PONV Risk Score and Plan: Ondansetron  Airway Management Planned:   Additional Equipment:   Intra-op Plan:   Post-operative Plan:   Informed Consent: I have reviewed the patients History and Physical, chart, labs and discussed the procedure including the risks, benefits and alternatives for the proposed anesthesia with the patient or authorized representative who has indicated his/her understanding and acceptance.     Dental Advisory Given  Plan Discussed with: CRNA  Anesthesia Plan Comments:        Anesthesia Quick Evaluation

## 2022-11-18 NOTE — H&P (Signed)
History and Physical    Patient: Carolyn Welch ZOX:[id number] DOB: 1955/10/06 DOA: 11/18/2022 DOS: the patient was seen and examined on 11/18/2022 PCP: Alvina Filbert, MD  Patient coming from: Home  Chief Complaint:  Chief Complaint  Patient presents with   Abdominal Pain   HPI: Carolyn Welch is a 67 y.o. female with medical history of chronic abdominal pain, GERD with hiatal hernia, abdominal/pelvic surgery, hypothyroidism who presented to the ED this morning with severe abdominal pain. She and her husband at the bedside relate that she experienced worsening right sided, becoming generalized abdominal pain yesterday worsening to extreme intensity overnight keeping her from sleeping, not improved with any OTC medications or suppositories. +emesis today. Complaining of very dry mouth now, last meal last night was normal. No fevers. Work up in ED revealed normal WBC, normal UA, and CT showing acute appendicitis. Surgery and hospital medicine teams were consulted. Plan is for antibiotics, IVF, pain control, and laparoscopic evaluation today.  Review of Systems: As mentioned in the history of present illness. All other systems reviewed and are negative. Past Medical History:  Diagnosis Date   Allergy to alpha-gal    Constipation    Fibromyalgia    GERD (gastroesophageal reflux disease)    Hayfever    Hyperlipidemia    Hypothyroidism    Past Surgical History:  Procedure Laterality Date   BALLOON DILATION N/A 03/11/2021   Procedure: BALLOON DILATION;  Surgeon: Lanelle Bal, DO;  Location: [facility];  Service: Endoscopy;  Laterality: N/A;   BALLOON DILATION N/A 02/02/2022   Procedure: BALLOON DILATION;  Surgeon: Lanelle Bal, DO;  Location: [facility];  Service: Endoscopy;  Laterality: N/A;   BIOPSY  03/11/2021   Procedure: BIOPSY;  Surgeon: Lanelle Bal, DO;  Location: [facility];  Service: Endoscopy;;   BIOPSY  02/02/2022   Procedure: BIOPSY;  Surgeon: Lanelle Bal, DO;  Location: [facility];  Service: Endoscopy;;  gastric   CARPAL TUNNEL RELEASE Bilateral    CATARACT EXTRACTION Left    CESAREAN SECTION     x3   COLONOSCOPY WITH PROPOFOL N/A 09/09/2020   Surgeon: Lanelle Bal, DO;  Nonbleeding internal hemorrhoids, diverticulosis in the sigmoid and descending colon.  Repeat in 10 years.   ESOPHAGOGASTRODUODENOSCOPY (EGD) WITH PROPOFOL N/A 03/11/2021   Surgeon: Lanelle Bal, DO; small hiatal hernia, grade C reflux esophagitis, benign-appearing esophageal stenosis dilated, gastritis biopsied. Pathology with reactive gastropathy with intestinal metaplasia, negative for H. pylori.   ESOPHAGOGASTRODUODENOSCOPY (EGD) WITH PROPOFOL N/A 02/02/2022   Procedure: ESOPHAGOGASTRODUODENOSCOPY (EGD) WITH PROPOFOL;  Surgeon: Lanelle Bal, DO;  Location: [facility];  Service: Endoscopy;  Laterality: N/A;  8:45am   SINUS EXPLORATION     TONSILLECTOMY     Social History:  reports that she has quit smoking. Her smoking use included cigarettes. She has a 6.50 pack-year smoking history. She has never used smokeless tobacco. She reports current alcohol use. She reports that she does not use drugs.  Allergies  Allergen Reactions   Alpha-Gal Anaphylaxis and Swelling   Cephalosporins Hives   Clindamycin Hives   Codeine Itching   Doxycycline Hives   Sulfa Antibiotics     Redness     Family History  Problem Relation Age of Onset   Cancer Mother    Hypertension Mother    Multiple sclerosis Father    Esophageal cancer Maternal Aunt    Colon cancer Paternal Grandmother        diagnosed  in her 90s    Prior to Admission medications   Medication Sig Start Date End Date Taking? Authorizing Provider  acetaminophen (TYLENOL) 650 MG CR tablet Take 650-1,300 mg by mouth every 8 (eight) hours as needed for pain.    [provider]  Alum Hydroxide-Mag Trisilicate (GAVISCON) 80-14.2 MG CHEW Chew 2 tablets by mouth daily as needed  (indigestion).    [provider]  calcium carbonate (TUMS EX) 750 MG chewable tablet Chew 1 tablet (750 mg total) by mouth 2 (two) times daily. As needed for heartburn Patient taking differently: Chew 2 tablets by mouth as needed. As needed for heartburn 02/02/22   Lanelle Bal, DO  cyclobenzaprine (FLEXERIL) 10 MG tablet Take 10 mg by mouth 2 (two) times daily as needed for muscle spasms.    [provider]  diphenhydrAMINE (BENADRYL) 25 MG tablet Take 50 mg by mouth every 6 (six) hours as needed for allergies.    [provider]  EPINEPHrine 0.3 mg/0.3 mL IJ SOAJ injection Inject 0.3 mg into the muscle as needed for anaphylaxis. 09/26/20   [provider]  hydrocortisone 2.5 % cream Apply 1 Application topically 2 (two) times daily as needed (rosacea). 10/28/21   [provider]  ibuprofen (ADVIL) 200 MG tablet Take 200 mg by mouth every 6 (six) hours as needed for moderate pain.    [provider]  levothyroxine (SYNTHROID) 100 MCG tablet Take 100 mcg by mouth daily before breakfast.    [provider]  loratadine (CLARITIN) 10 MG tablet Take 10 mg by mouth daily.    [provider]  mupirocin ointment (BACTROBAN) 2 % Apply 1 Application topically 3 (three) times daily as needed (rosacea). 10/28/21   [provider]  omeprazole (PRILOSEC OTC) 20 MG tablet Take 1 tablet (20 mg total) by mouth 2 (two) times daily. Patient taking differently: Take 20 mg by mouth daily. 02/02/22 02/02/23  Lanelle Bal, DO  spironolactone (ALDACTONE) 100 MG tablet Take 100 mg by mouth 2 (two) times daily. 06/26/20   [provider]  traZODone (DESYREL) 100 MG tablet Take 100 mg by mouth at bedtime.    [provider]  VALTREX 500 MG tablet Take 500 mg by mouth 2 (two) times daily. 02/18/21   [provider]    Physical Exam: Vitals:   11/18/22 0800 11/18/22 0900 11/18/22 0945 11/18/22 1042  BP: 129/64  100/63 106/62   Pulse: 81 83 84   Resp:      Temp:    99.8 F (37.7 C)  TempSrc:    Oral  SpO2: 94% 93% 90%   Weight:      Gen: WDWN female in mild distress HEENT: Extremely dry mucous membranes Pulm: Clear, nonlabored  CV: RRR, no MRG or pitting edema GI: Tender diffusely to light palpation, +guarding limits exam, hypoactive BS.  Neuro: Interactive and oriented. No new focal deficits. Ext: Warm, no deformities. Skin: No rashes, lesions or ulcers on visualized skin   Data Reviewed: WBC 9k, hgb 14.4 g/dl, plt 540 Cr 9.81, BUN 25 Glucose 151 Na 133 UA negative CT abd/pelvis showed dilated, thickened appendix with surrounding inflammation, associated appendicoliths and free fluid without intraperitoneal air. Also Diverticulosis, mod hiatal hernia with fluid in distal esophagus, multilevel nonacute spinal arthropathy, and   Assessment and Plan: Acute appendicitis: with free fluid but no intraperitoneal air to suggest frank rupture. Her exam is more peritonitic than my report from EDP, though. No leukocytosis or fever,  other vitals stable at this time.  - Cipro/flagyl - Surgery consulted, planning to OR today, keeping NPO. No history of CAD or CHF, no recent symptoms. Normal functional capacity. No further work up needed before surgery. - Still appears dry, repeat 1L NS bolus, continue IVF - Continue IV dilaudid for pain, hold for sedation  HTN: On spironolactone.  - Hold spiro for now  Hypothyroidism:  - Continue synthroid tmrw AM    Advance Care Planning: Full code confirmed with pt, spouse at bedside  Consults: General surgery  Family Communication: Husband at bedside  Severity of Illness: The appropriate patient status for this patient is INPATIENT. Inpatient status is judged to be reasonable and necessary in order to provide the required intensity of service to ensure the patient's safety. The patient's presenting symptoms, physical exam findings, and initial  radiographic and laboratory data in the context of their chronic comorbidities is felt to place them at high risk for further clinical deterioration. Furthermore, it is not anticipated that the patient will be medically stable for discharge from the hospital within 2 midnights of admission.   * I certify that at the point of admission it is my clinical judgment that the patient will require inpatient hospital care spanning beyond 2 midnights from the point of admission due to high intensity of service, high risk for further deterioration and high frequency of surveillance required.*  Author: Tyrone Nine, MD 11/18/2022 10:47 AM  For on call review [email or web address].

## 2022-11-18 NOTE — Anesthesia Procedure Notes (Addendum)
Procedure Name: Intubation Date/Time: 11/18/2022 12:11 PM  Performed by: Julian Reil, CRNAPre-anesthesia Checklist: Patient identified, Emergency Drugs available, Suction available and Patient being monitored Patient Re-evaluated:Patient Re-evaluated prior to induction Oxygen Delivery Method: Circle system utilized Preoxygenation: Pre-oxygenation with 100% oxygen Induction Type: IV induction, Rapid sequence and Cricoid Pressure applied Laryngoscope Size: Glidescope and 3 Grade View: Grade I Tube type: Oral Tube size: 7.5 mm Number of attempts: 1 Airway Equipment and Method: Stylet and Video-laryngoscopy Placement Confirmation: ETT inserted through vocal cords under direct vision, positive ETCO2 and breath sounds checked- equal and bilateral Secured at: 22 cm Tube secured with: Tape Dental Injury: Teeth and Oropharynx as per pre-operative assessment  Comments: Glidescope electively used due to N/V this a.m. and planned RSI.

## 2022-11-18 NOTE — Transfer of Care (Signed)
Immediate Anesthesia Transfer of Care Note  Patient: Carolyn Welch  Procedure(s) Performed: XI ROBOTIC LAPAROSCOPIC ASSISTED APPENDECTOMY WITH PARTIAL OMENTECTOMY (Abdomen) XI ROBOTIC ASSISTED LAPAROSCOPIC LYSIS OF ADHESION (Abdomen)  Patient Location: PACU  Anesthesia Type:General  Level of Consciousness: awake  Airway & Oxygen Therapy: Patient Spontanous Breathing and Patient connected to face mask oxygen  Post-op Assessment: Report given to RN and Post -op Vital signs reviewed and stable  Post vital signs: Reviewed and stable  Last Vitals:  Vitals Value Taken Time  BP 110/75 11/18/22 1404  Temp    Pulse 103 11/18/22 1405  Resp    SpO2 99 % 11/18/22 1405  Vitals shown include unvalidated device data.  Last Pain:  Vitals:   11/18/22 1124  TempSrc:   PainSc: 0-No pain         Complications: No notable events documented.

## 2022-11-19 DIAGNOSIS — K3532 Acute appendicitis with perforation and localized peritonitis, without abscess: Secondary | ICD-10-CM

## 2022-11-19 LAB — CBC
HCT: 33.9 % — ABNORMAL LOW (ref 36.0–46.0)
Hemoglobin: 11.5 g/dL — ABNORMAL LOW (ref 12.0–15.0)
MCH: 34.1 pg — ABNORMAL HIGH (ref 26.0–34.0)
MCHC: 33.9 g/dL (ref 30.0–36.0)
MCV: 100.6 fL — ABNORMAL HIGH (ref 80.0–100.0)
Platelets: 208 10*3/uL (ref 150–400)
RBC: 3.37 MIL/uL — ABNORMAL LOW (ref 3.87–5.11)
RDW: 12.9 % (ref 11.5–15.5)
WBC: 13.1 10*3/uL — ABNORMAL HIGH (ref 4.0–10.5)
nRBC: 0 % (ref 0.0–0.2)

## 2022-11-19 LAB — BASIC METABOLIC PANEL
Anion gap: 7 (ref 5–15)
BUN: 14 mg/dL (ref 8–23)
CO2: 25 mmol/L (ref 22–32)
Calcium: 8.1 mg/dL — ABNORMAL LOW (ref 8.9–10.3)
Chloride: 101 mmol/L (ref 98–111)
Creatinine, Ser: 0.73 mg/dL (ref 0.44–1.00)
GFR, Estimated: >60 mL/min (ref >60)
Glucose, Bld: 120 mg/dL — ABNORMAL HIGH (ref 70–99)
Potassium: 4.3 mmol/L (ref 3.5–5.1)
Sodium: 133 mmol/L — ABNORMAL LOW (ref 135–145)

## 2022-11-19 MED ORDER — SPIRONOLACTONE 100 MG PO TABS
100.0000 mg | ORAL_TABLET | Freq: Two times a day (BID) | ORAL | Status: DC
  Administered 2022-11-19 – 2022-11-20 (×2): 100 mg via ORAL
  Filled 2022-11-19 (×2): qty 1

## 2022-11-19 MED ORDER — DIPHENHYDRAMINE HCL 25 MG PO CAPS
25.0000 mg | ORAL_CAPSULE | ORAL | Status: DC | PRN
  Administered 2022-11-19: 25 mg via ORAL
  Filled 2022-11-19: qty 1

## 2022-11-19 MED ORDER — DIPHENHYDRAMINE HCL 50 MG/ML IJ SOLN
25.0000 mg | Freq: Once | INTRAMUSCULAR | Status: AC
  Administered 2022-11-19: 25 mg via INTRAVENOUS
  Filled 2022-11-19: qty 1

## 2022-11-19 MED ORDER — ALUM & MAG HYDROXIDE-SIMETH 200-200-20 MG/5ML PO SUSP
15.0000 mL | Freq: Once | ORAL | Status: DC
  Filled 2022-11-19: qty 30

## 2022-11-19 MED ORDER — TRAZODONE HCL 50 MG PO TABS
100.0000 mg | ORAL_TABLET | Freq: Every day | ORAL | Status: DC
  Administered 2022-11-19 – 2022-11-21 (×3): 100 mg via ORAL
  Filled 2022-11-19 (×3): qty 2

## 2022-11-19 MED ORDER — METHOCARBAMOL 500 MG PO TABS
500.0000 mg | ORAL_TABLET | Freq: Three times a day (TID) | ORAL | Status: DC
  Administered 2022-11-19 – 2022-11-21 (×8): 500 mg via ORAL
  Filled 2022-11-19 (×9): qty 1

## 2022-11-19 MED ORDER — LORATADINE 10 MG PO TABS
10.0000 mg | ORAL_TABLET | Freq: Every day | ORAL | Status: DC
  Administered 2022-11-19 – 2022-11-22 (×4): 10 mg via ORAL
  Filled 2022-11-19 (×4): qty 1

## 2022-11-19 MED ORDER — ORAL CARE MOUTH RINSE: 15.0000 mL | OROMUCOSAL | Status: DC | PRN

## 2022-11-19 MED ORDER — DIPHENHYDRAMINE HCL 50 MG/ML IJ SOLN
50.0000 mg | Freq: Four times a day (QID) | INTRAMUSCULAR | Status: DC | PRN
  Administered 2022-11-19 – 2022-11-22 (×5): 50 mg via INTRAVENOUS
  Filled 2022-11-19 (×5): qty 1

## 2022-11-19 MED ORDER — SODIUM CHLORIDE 0.9 % IV SOLN: INTRAVENOUS | Status: DC

## 2022-11-19 MED ORDER — DIPHENHYDRAMINE HCL 50 MG/ML IJ SOLN
25.0000 mg | Freq: Four times a day (QID) | INTRAMUSCULAR | Status: DC | PRN
  Administered 2022-11-19: 25 mg via INTRAVENOUS
  Filled 2022-11-19: qty 1

## 2022-11-19 NOTE — Progress Notes (Addendum)
[facility] Progress Note  1 Day Post-Op  Subjective: Patient seen and examined.  She is resting in chair at the side of the bed.  She complains of diffuse abdominal pain which is about the same as what it was yesterday.  She was able to tolerate some liquids without nausea and vomiting.  She denies any flatus or bowel movement since the surgery.  Her JP drain has had 265 cc of output in the last 24 hours and drainage appears serosanguineous.  Objective: Vital signs in last 24 hours: Temp:  [98.4 F (36.9 C)-98.5 F (36.9 C)] 98.5 F (36.9 C) (07/04 0400) Pulse Rate:  [66-98] 84 (07/04 1226) Resp:  [13-18] 16 (07/04 0400) BP: (87-152)/(39-116) 152/68 (07/04 1226) SpO2:  [94 %-100 %] 96 % (07/04 0400)    Intake/Output from previous day: 07/03 0701 - 07/04 0700 In: 2540 [P.O.:440; I.V.:1800; IV Piggyback:300] Out: 2065 [Urine:1800; Drains:265] Intake/Output this shift: Total I/O In: -  Out: 50 [Drains:50]  General appearance: alert, cooperative, and no distress GI: Abdomen soft, moderate distention, no percussion tenderness, diffuse tenderness to palpation; no rigidity, guarding, rebound tenderness; incisions C/D/I, JP drain in place with serosanguineous output  Lab Results:  Recent Labs    11/18/22 0700 11/19/22 0447  WBC 9.0 13.1*  HGB 14.4 11.5*  HCT 41.6 33.9*  PLT 262 208   BMET Recent Labs    11/18/22 0700 11/19/22 0447  NA 133* 133*  K 3.8 4.3  CL 98 101  CO2 25 25  GLUCOSE 151* 120*  BUN 25* 14  CREATININE 0.74 0.73  CALCIUM 9.0 8.1*   PT/INR No results for input(s): "LABPROT", "INR" in the last 72 hours.  Studies/Results: CT ABDOMEN PELVIS W CONTRAST  Result Date: 11/18/2022 CLINICAL DATA:  Right-sided abdominal pain for 1 day. EXAM: CT ABDOMEN AND PELVIS WITH CONTRAST TECHNIQUE: Multidetector CT imaging of the abdomen and pelvis was performed using the standard protocol following bolus administration of intravenous contrast.  RADIATION DOSE REDUCTION: This exam was performed according to the departmental dose-optimization program which includes automated exposure control, adjustment of the mA and/or kV according to patient size and/or use of iterative reconstruction technique. CONTRAST:  OMNIPAQUE IOHEXOL 300 MG/ML  SOLN COMPARISON:  None Available. FINDINGS: Lower chest: Hyperdense material which appears to be within the left lower lobe vasculature may reflect embolized cement material. The imaged lung bases are otherwise unremarkable. The imaged heart is unremarkable. Hepatobiliary: The liver and gallbladder are unremarkable. There is no biliary ductal dilatation. Pancreas: Unremarkable. Spleen: Unremarkable. Adrenals/Urinary Tract: The adrenals are unremarkable. The kidneys are unremarkable, with no focal lesion, stone, hydronephrosis, or hydroureter. There is symmetric excretion of contrast into the collecting systems on the delayed images. The bladder is unremarkable. Stomach/Bowel: There is a moderate-sized hiatal hernia with fluid in the distal esophagus likely reflecting reflux. The stomach is otherwise unremarkable. There is no evidence of bowel obstruction. There is background colonic diverticulosis. The appendix is dilated and inflamed measuring up to 1.2 cm in diameter with multiple appendicoliths at the base. There is wall thickening in the adjacent distal ileum as well as the: Which is favored to reflect reactive inflammatory change. There is surrounding free fluid in the right lower quadrant, left paracolic gutter, and pelvis but no evidence of organized or drainable abscess. There is no free intraperitoneal air to suggest frank perforation. Vascular/Lymphatic: There is calcified plaque in the nonaneurysmal abdominal aorta. The major branch vessels are patent. The main portal and splenic veins are  patent. There is no abdominal or pelvic lymphadenopathy. Reproductive: The uterus and adnexa are unremarkable. Other:  There is a small fat containing umbilical hernia. Musculoskeletal: There is no acute osseous abnormality or suspicious osseous lesion. The patient is status post vertebral augmentation at T7, L1, and L5. There are age-indeterminate compression fractures of the T11, T12, and L2 through L4 vertebral bodies. There is no bony retropulsion at any level. IMPRESSION: 1. Acute appendicitis with surrounding free fluid but no evidence of organized or drainable abscess or free intraperitoneal air. Multiple appendicoliths are noted at the base of the appendix. 2. Probable reactive terminal ileitis and colitis. 3. Moderate-sized hiatal hernia with fluid in the distal esophagus likely reflecting reflux. 4. Diverticulosis. 5. Status post vertebral augmentation at T7, L1, and L5 with a probable small amount of embolized cement material in the left lower lobe. 6. Age-indeterminate compression fractures of the T11, T12, and L2 through L4 vertebral bodies. Electronically Signed   By: Lesia Hausen M.D.   On: 11/18/2022 08:57    Anti-infectives: Anti-infectives (From admission, onward)    Start     Dose/Rate Route Frequency Ordered Stop   11/18/22 2200  ciprofloxacin (CIPRO) IVPB 400 mg       See Hyperspace for full Linked Orders Report.   400 mg 200 mL/hr over 60 Minutes Intravenous Every 12 hours 11/18/22 1548     11/18/22 2200  metroNIDAZOLE (FLAGYL) IVPB 500 mg       See Hyperspace for full Linked Orders Report.   500 mg 100 mL/hr over 60 Minutes Intravenous Every 12 hours 11/18/22 1548     11/18/22 0915  ciprofloxacin (CIPRO) IVPB 400 mg       See Hyperspace for full Linked Orders Report.   400 mg 200 mL/hr over 60 Minutes Intravenous  Once 11/18/22 0903 11/18/22 1108   11/18/22 0915  metroNIDAZOLE (FLAGYL) IVPB 500 mg       See Hyperspace for full Linked Orders Report.   500 mg 100 mL/hr over 60 Minutes Intravenous  Once 11/18/22 1610 11/18/22 1108       Assessment/Plan:  Patient is a 67 year old female  who is status post robotic assisted laparoscopic appendectomy, lysis of adhesions, and partial omentectomy on 7/3.  She was noted to have perforated appendicitis at that time.  -Mild increase in leukocytosis today to 13.1 from 9.  Suspect this is related to surgery -Continue Cipro and Flagyl.  Patient states that she is having itching and thought this could be secondary to her antibiotic.  Continue to monitor for any evidence of hives -Benadryl ordered by hospitalist -Will advance to GI soft diet to allow patient to have more options with her alpha-gal allergy -Continue bowel regimen with Senokot-S and Dulcolax suppositories -Monitor for return of bowel function -Given the significant amount of inflammation and contamination at the time of surgery, suspect that the patient will have a postoperative ileus -Continue scheduled Tylenol and as needed oxycodone and Dilaudid.  Will add on Robaxin -Encouraged ambulation -Appreciate hospitalist recommendations   LOS: 1 day    Eddie Koc A Chrisy Hillebrand 11/19/2022

## 2022-11-19 NOTE — TOC CM/SW Note (Signed)
Transition of Care [facility]) - Inpatient Brief Assessment   Patient Details  Name: Carolyn Welch MRN: [id number] Date of Birth: July 12, 1955  Transition of Care [facility]) CM/SW Contact:    Elliot Gault, LCSW Phone Number: 11/19/2022, 10:15 AM   Clinical Narrative:  Transition of Care Department [facility]) has reviewed patient and no TOC needs have been identified at this time. We will continue to monitor patient advancement through interdisciplinary progression rounds. If new patient transition needs arise, please place a TOC consult.  Transition of Care Asessment: Insurance and Status: Insurance coverage has been reviewed Patient has primary care physician: Yes Home environment has been reviewed: From home with spouse Prior level of function:: independent Prior/Current Home Services: No current home services Social Determinants of Health Reivew: SDOH reviewed no interventions necessary Readmission risk has been reviewed: Yes Transition of care needs: no transition of care needs at this time

## 2022-11-19 NOTE — Progress Notes (Signed)
[facility] PROGRESS NOTE  Carolyn Welch (DOB: Jun 24, 1955) ZHY:[id number] PCP: Alvina Filbert, MD  Brief Narrative: Carolyn Welch is a 67 y.o. female with a history of GERD, hiatal hernia, hypothyroidism who presented to the ED on 11/18/2022 with abdominal pain found to have acute perforated appendicitis now s/p laparoscopic appendectomy and washout with JP drain placement.   Subjective: Pain still severe at times, worse with ambulation. Having body-wide itching starting yesterday evening resolved with IV benadryl. No rashes.   Objective: BP (!) 152/68 (BP Location: Left Arm)   Pulse 84   Temp 98.5 F (36.9 C) (Oral)   Resp 16   Wt 70.3 kg   SpO2 96%   BMI 29.29 kg/m   Gen: In some discomfort but not distressed Pulm: Clear, nonlabored  CV: RRR, no MRG GI: Soft, tender without rebound, JP drain with serosanguinous discharge from LLQ, laparoscopic site dressing c/d/i Neuro: Alert and oriented. No new focal deficits. Ext: Warm, no deformities. Skin: No other rashes, lesions or ulcers on visualized skin, though still intensely pruritic.  Assessment & Plan: Acute perforated appendicitis: s/p robotic-assisted laparoscopic appendectomy, partial omentectomy, LOA 7/3.  - Continue antibiotics as ordered. She tolerates levaquin, so has high likelihood of tolerating cipro and flagyl. Changing to an alternative antibiotic regimen is fraught with her multiple severe allergies. She does take augmentin for sinus infections from time to time with good tolerance, so this and/or zosyn may be an option. If she develops objective cutaneous evidence of allergy, would necessitate change in antibiotics.  - Pain control per surgery, d/w Dr. Robyne Peers - JP drain and wound management per surgery.  Pruritus: Without rash/hives, etc. History of same with codeine and is taking dilaudid and oxycodone now.  - Start benadryl 25mg  IV q6h prn as this resolved symptoms previously. Oral preparation stands the  risk of inadequate absorption at this time. - Monitor closely.   ABLA:  - Monitor in AM.  HTN: - Restart home spironolactone  Tyrone Nine, MD [facility] [email or web address] 11/19/2022, 2:04 PM

## 2022-11-19 NOTE — Progress Notes (Signed)
After receiving Cipro IV pt c/o generalized itching, no other signs of allergic reaction, no rash, no difficulty breathing, no edema, mental status unchanged, VS stable, no distress on assessment.  MD on call notified. New orders placed. Will continue to monitor pt.

## 2022-11-20 ENCOUNTER — Inpatient Hospital Stay (HOSPITAL_COMMUNITY): Payer: Medicare Other

## 2022-11-20 DIAGNOSIS — K3532 Acute appendicitis with perforation and localized peritonitis, without abscess: Secondary | ICD-10-CM | POA: Diagnosis not present

## 2022-11-20 LAB — CBC
HCT: 33.2 % — ABNORMAL LOW (ref 36.0–46.0)
Hemoglobin: 11 g/dL — ABNORMAL LOW (ref 12.0–15.0)
MCH: 34.1 pg — ABNORMAL HIGH (ref 26.0–34.0)
MCHC: 33.1 g/dL (ref 30.0–36.0)
MCV: 102.8 fL — ABNORMAL HIGH (ref 80.0–100.0)
Platelets: 205 10*3/uL (ref 150–400)
RBC: 3.23 MIL/uL — ABNORMAL LOW (ref 3.87–5.11)
RDW: 13.1 % (ref 11.5–15.5)
WBC: 15.9 10*3/uL — ABNORMAL HIGH (ref 4.0–10.5)
nRBC: 0 % (ref 0.0–0.2)

## 2022-11-20 LAB — SURGICAL PATHOLOGY

## 2022-11-20 MED ORDER — SPIRONOLACTONE 100 MG PO TABS
100.0000 mg | ORAL_TABLET | Freq: Every day | ORAL | Status: DC
  Administered 2022-11-21 – 2022-11-22 (×2): 100 mg via ORAL
  Filled 2022-11-20 (×2): qty 1

## 2022-11-20 MED ORDER — VALACYCLOVIR HCL 500 MG PO TABS
500.0000 mg | ORAL_TABLET | Freq: Two times a day (BID) | ORAL | Status: DC
  Administered 2022-11-20 – 2022-11-22 (×5): 500 mg via ORAL
  Filled 2022-11-20 (×5): qty 1

## 2022-11-20 MED ORDER — ALUM & MAG HYDROXIDE-SIMETH 200-200-20 MG/5ML PO SUSP
30.0000 mL | ORAL | Status: DC | PRN
  Administered 2022-11-20 – 2022-11-21 (×2): 30 mL via ORAL
  Filled 2022-11-20 (×2): qty 30

## 2022-11-20 MED ORDER — ALUM & MAG HYDROXIDE-SIMETH 200-200-20 MG/5ML PO SUSP
15.0000 mL | Freq: Once | ORAL | Status: AC
  Administered 2022-11-20: 15 mL via ORAL
  Filled 2022-11-20: qty 30

## 2022-11-20 NOTE — Progress Notes (Signed)
[facility] PROGRESS NOTE  Carolyn Welch (DOB: [birth year]) [id number]:[id number] PCP: Alvina Filbert, MD  Brief Narrative: Carolyn Welch is a 67 y.o. female with a history of GERD, hiatal hernia, hypothyroidism who presented to the ED on 11/18/2022 with abdominal pain found to have acute perforated appendicitis now s/p laparoscopic appendectomy and washout with JP drain placement.   Subjective: Areas of itching are without rash and are diffuse, improved significantly with benadryl. No fevers or chills or other complaints. Pain severe when getting up, but has been able to get by on oxycodone thus far.  Objective: BP 103/67 (BP Location: Right Arm)   Pulse 76   Temp 98.5 F (36.9 C) (Oral)   Resp 17   Wt 70.3 kg   SpO2 94%   BMI 29.29 kg/m   Gen: No distress  Pulm: Clear, nonlabored  CV: RRR, no MRG or edema GI: Soft, tender to light palpation, hypoactive BS Neuro: Alert and oriented. No new focal deficits. Ext: Warm, no deformities. Skin: There are no rashes noted on visualized skin. Surgical dressing c/d/i  Assessment & Plan: Acute perforated appendicitis: s/p robotic-assisted laparoscopic appendectomy, partial omentectomy, LOA 7/3.  - Continue cipro/flagyl. In the absence of hives, bullae, etc. allergy to these medications is less likely. - Pain control > continue with oxycodone and IV dilaudid. Doubt change to any other analgesic that is strong enough to alleviate/moderate pain would bring about any appreciable gains in pruritus. - JP drain and wound management per surgery. - Abd XR per surgery, diet per surgery.  Pruritus: Without rash/hives, etc. History of same with codeine and is taking dilaudid and oxycodone now.  - continue IV benadryl today since there is ongoing concern for postoperative ileus. No sedation noted at all and this is required to moderate symptoms related to treatment that cannot be changed.   ABLA: Relatively stable with no significant hemorrhage  currently noted.   HTN: - Restarted home spironolactone. BP a bit soft, so decrease to once daily dosing.  Tyrone Nine, MD [facility] [email or web address] 11/20/2022, 1:08 PM

## 2022-11-20 NOTE — Progress Notes (Signed)
[facility] Progress Note  2 Days Post-Op  Subjective: Patient seen and examined.  She is resting comfortably in chair at the side of the bed.  She still feels distended and denies any flatus or bowel movements.  Her abdominal pain is a little bit better today, and it is mostly when she is getting up to try and move around.  She has not taken any Dilaudid today.  She is tolerating some food without nausea and vomiting.  JP drain in place with 110 cc of serosanguineous output in the last 24 hours.  Objective: Vital signs in last 24 hours: Temp:  [98.3 F (36.8 C)-98.5 F (36.9 C)] 98.5 F (36.9 C) (07/05 0312) Pulse Rate:  [76-84] 76 (07/05 0312) Resp:  [17] 17 (07/05 0312) BP: (103-152)/(66-68) 103/67 (07/05 0312) SpO2:  [94 %-98 %] 94 % (07/05 0312) Last BM Date : 11/17/22  Intake/Output from previous day: 07/04 0701 - 07/05 0700 In: 1151.5 [I.V.:1151.5] Out: 110 [Drains:110] Intake/Output this shift: No intake/output data recorded.  General appearance: alert, cooperative, and no distress GI: Abdomen soft, moderate distention, no percussion tenderness, mild right lower quadrant tenderness to palpation; no rigidity, guarding, rebound tenderness; incisions C/D/I with skin staples in place, JP drain in place with serosanguineous output  Lab Results:  Recent Labs    11/19/22 0447 11/20/22 0413  WBC 13.1* 15.9*  HGB 11.5* 11.0*  HCT 33.9* 33.2*  PLT 208 205   BMET Recent Labs    11/18/22 0700 11/19/22 0447  NA 133* 133*  K 3.8 4.3  CL 98 101  CO2 25 25  GLUCOSE 151* 120*  BUN 25* 14  CREATININE 0.74 0.73  CALCIUM 9.0 8.1*   PT/INR No results for input(s): "LABPROT", "INR" in the last 72 hours.  Studies/Results: No results found.  Anti-infectives: Anti-infectives (From admission, onward)    Start     Dose/Rate Route Frequency Ordered Stop   11/20/22 1000  valACYclovir (VALTREX) tablet 500 mg        500 mg Oral 2 times daily 11/20/22 0851      11/18/22 2200  ciprofloxacin (CIPRO) IVPB 400 mg       See Hyperspace for full Linked Orders Report.   400 mg 200 mL/hr over 60 Minutes Intravenous Every 12 hours 11/18/22 1548     11/18/22 2200  metroNIDAZOLE (FLAGYL) IVPB 500 mg       See Hyperspace for full Linked Orders Report.   500 mg 100 mL/hr over 60 Minutes Intravenous Every 12 hours 11/18/22 1548     11/18/22 0915  ciprofloxacin (CIPRO) IVPB 400 mg       See Hyperspace for full Linked Orders Report.   400 mg 200 mL/hr over 60 Minutes Intravenous  Once 11/18/22 0903 11/18/22 1108   11/18/22 0915  metroNIDAZOLE (FLAGYL) IVPB 500 mg       See Hyperspace for full Linked Orders Report.   500 mg 100 mL/hr over 60 Minutes Intravenous  Once 11/18/22 7829 11/18/22 1108       Assessment/Plan:  Patient is a 67 year old female who is status post robotic assisted laparoscopic appendectomy, lysis of adhesions, and partial omentectomy on 7/3.  She was noted to have perforated appendicitis at that time.   -Leukocytosis increased today from 13.1 to 15.9. Continue to monitor with AM labs -Continue Cipro and Flagyl -GI soft diet -KUB demonstrating some mildly dilated loops of small bowel, likely postoperative ileus -Given the significant amount of inflammation and contamination at the time  of surgery, suspect that the patient will have a postoperative ileus.  Advised the patient that sometimes it can take several days for her bowels to fully wake up -Continue bowel regimen with Senokot-S and Dulcolax suppositories -Monitor for return of bowel function -Continue scheduled Tylenol, scheduled robaxin, and as needed oxycodone and Dilaudid -Encouraged ambulation -Appreciate hospitalist recommendations   LOS: 2 days    Jorgen Wolfinger A Vienne Corcoran 11/20/2022

## 2022-11-20 NOTE — Care Management Important Message (Signed)
Important Message  Patient Details  Name: Carolyn Welch MRN: [id number] Date of Birth: 01/02/1956   Medicare Important Message Given:  Yes     [name] 11/20/2022, 1:38 PM

## 2022-11-20 NOTE — Anesthesia Postprocedure Evaluation (Signed)
Anesthesia Post Note  Patient: Carolyn Welch  Procedure(s) Performed: XI ROBOTIC LAPAROSCOPIC ASSISTED APPENDECTOMY WITH PARTIAL OMENTECTOMY (Abdomen) XI ROBOTIC ASSISTED LAPAROSCOPIC LYSIS OF ADHESION (Abdomen)  Patient location during evaluation: Phase II Anesthesia Type: General Level of consciousness: awake Pain management: pain level controlled Vital Signs Assessment: post-procedure vital signs reviewed and stable Respiratory status: spontaneous breathing and respiratory function stable Cardiovascular status: blood pressure returned to baseline and stable Postop Assessment: no headache and no apparent nausea or vomiting Anesthetic complications: no Comments: Late entry   No notable events documented.   Last Vitals:  Vitals:   11/20/22 0312 11/20/22 1324  BP: 103/67 124/67  Pulse: 76 77  Resp: 17 17  Temp: 36.9 C 36.6 C  SpO2: 94% 100%    Last Pain:  Vitals:   11/20/22 1100  TempSrc:   PainSc: 4                  Windell Norfolk

## 2022-11-21 ENCOUNTER — Inpatient Hospital Stay (HOSPITAL_COMMUNITY): Payer: Medicare Other

## 2022-11-21 DIAGNOSIS — K3532 Acute appendicitis with perforation and localized peritonitis, without abscess: Secondary | ICD-10-CM | POA: Diagnosis not present

## 2022-11-21 LAB — BASIC METABOLIC PANEL
Anion gap: 8 (ref 5–15)
BUN: 14 mg/dL (ref 8–23)
CO2: 26 mmol/L (ref 22–32)
Calcium: 8.6 mg/dL — ABNORMAL LOW (ref 8.9–10.3)
Chloride: 102 mmol/L (ref 98–111)
Creatinine, Ser: 0.7 mg/dL (ref 0.44–1.00)
GFR, Estimated: >60 mL/min (ref >60)
Glucose, Bld: 96 mg/dL (ref 70–99)
Potassium: 3.5 mmol/L (ref 3.5–5.1)
Sodium: 136 mmol/L (ref 135–145)

## 2022-11-21 LAB — CBC
HCT: 32.8 % — ABNORMAL LOW (ref 36.0–46.0)
Hemoglobin: 11.3 g/dL — ABNORMAL LOW (ref 12.0–15.0)
MCH: 33.8 pg (ref 26.0–34.0)
MCHC: 34.5 g/dL (ref 30.0–36.0)
MCV: 98.2 fL (ref 80.0–100.0)
Platelets: 228 10*3/uL (ref 150–400)
RBC: 3.34 MIL/uL — ABNORMAL LOW (ref 3.87–5.11)
RDW: 13 % (ref 11.5–15.5)
WBC: 16.3 10*3/uL — ABNORMAL HIGH (ref 4.0–10.5)
nRBC: 0 % (ref 0.0–0.2)

## 2022-11-21 MED ORDER — MAGNESIUM CITRATE PO SOLN
1.0000 | Freq: Once | ORAL | Status: AC
  Administered 2022-11-21: 1 via ORAL
  Filled 2022-11-21: qty 296

## 2022-11-21 MED ORDER — POLYETHYLENE GLYCOL 3350 17 G PO PACK
17.0000 g | PACK | Freq: Every day | ORAL | Status: DC
  Administered 2022-11-21 – 2022-11-22 (×2): 17 g via ORAL
  Filled 2022-11-21 (×2): qty 1

## 2022-11-21 MED ORDER — PROCHLORPERAZINE EDISYLATE 10 MG/2ML IJ SOLN
10.0000 mg | Freq: Four times a day (QID) | INTRAMUSCULAR | Status: DC | PRN
  Administered 2022-11-21: 10 mg via INTRAVENOUS
  Filled 2022-11-21: qty 2

## 2022-11-21 NOTE — Discharge Instructions (Signed)
Ambulatory Surgery Discharge Instructions  Activity  You are advised to go directly home from the hospital.  Resume light activity. No heavy lifting over 10 lbs or strenuous exercise.  Fluids and Diet Regular diet  Medications  If you have not had a bowel movement in 24 hours, take 2 tablespoons over the counter Milk of mag.             You May resume your blood thinners tomorrow (Aspirin, coumadin, or other).  You are being discharged with prescriptions for Opioid/Narcotic Medications: There are some specific considerations for these medications that you should know. Opioid Meds have risks & benefits. Addiction to these meds is always a concern with prolonged use Take medication only as directed Do not drive while taking narcotic pain medication Do not crush tablets or capsules Do not use a different container than medication was dispensed in Lock the container of medication in a cool, dry place out of reach of children and pets. Opioid medication can cause addiction Do not share with anyone else (this is a felony) Do not store medications for future use. Dispose of them properly.     Disposal:  Find a [employer] household drug take back site near you.  If you can't get to a drug take back site, use the recipe below as a last resort to dispose of expired, unused or unwanted drugs. Disposal  (Do not dispose chemotherapy drugs this way, talk to your prescribing doctor instead.) Step 1: Mix drugs (do not crush) with dirt, kitty litter, or used coffee grounds and add a small amount of water to dissolve any solid medications. Step 2: Seal drugs in plastic bag. Step 3: Place plastic bag in trash. Step 4: Take prescription container and scratch out personal information, then recycle or throw away.  Operative Site  You have skin staples at your incisions.  These will be removed in the office. Ok to shower. Keep wound clean and dry. No baths or swimming. No lifting more than 10  pounds.  Contact Information: If you have questions or concerns, please call our office, [phone], Monday- Thursday 8AM-5PM and Friday 8AM-12Noon.  If it is after hours or on the weekend, please call [facility] Main Number, [phone], and ask to speak to the surgeon on call for Dr. Robyne Peers at [facility].   SPECIFIC COMPLICATIONS TO WATCH FOR: Inability to urinate Fever over 101? F by mouth Nausea and vomiting lasting longer than 24 hours. Pain not relieved by medication ordered Swelling around the operative site Increased redness, warmth, hardness, around operative area Numbness, tingling, or cold fingers or toes Blood -soaked dressing, (small amounts of oozing may be normal) Increasing and progressive drainage from surgical area or exam site   Patient Instructions for Emptying Bulb (JP) Drains   Wash hands thoroughly with soap and water.  Unfasten drain from clothing. Use the markings on the bulb or a plastic measuring cup marked in "cc or ml".  Remove the drainage plug from drainage spout. Avoid touching the inside portion of the plug.  Turn bulb upside down over the container.  Squeeze the bulb gently to remove the fluid.  Wipe spout and drainage plug with an alcohol wipe.  Turn the bulb right side up.  Squeeze and collapse the bulb until it is flat.  Carefully place drainage plug back into spout and fasten bulb to clothing.  Record the amount of drainage, time and date on the second page.  If the bulb becomes filled with fluid  or becomes oval in shape (instead of collapsed-looking), follow steps 1-10 again.  Each day, gently clean the skin around the drain with soap and water, using a clean wash cloth each time. CALL YOU DOCTOR IF YOUR SKIN BECOMES RED, SORE OR SWOLLEN.   Strip the drains with alcohol every 4-8 hours to keep the fluid flowing.  Please feel free to call your doctors office if you have any questions or concerns.   Date:   Time Amount                              Date:   Time Amount                            Date:   Time Amount                            Date:   Time Amount                             Date:   Time Amount                            Date:   Time Amount                            Date:   Time Amount                            Date:   Time Amount

## 2022-11-21 NOTE — Progress Notes (Signed)
Pt received Zofran (See MAR) and is still complaining of nausea. Adefeso MD notified. Pt wants to walk the halls to help her flatulence, but cannot due to feeling sick. Will continue to monitor patient.

## 2022-11-21 NOTE — Progress Notes (Signed)
[facility] Progress Note  3 Days Post-Op  Subjective: Patient seen and examined.  She is resting comfortably in bed.  She was a little upset this morning, as she had a little bit more pain and abdominal distention.  She is currently feeling a little bit better now.  She feels that most of her abdominal pain is more related to her abdominal distention.  She denies any flatus or bowel movement since surgery.  She is tolerating a diet without nausea and vomiting.  JP drain remains in place with 40 cc of serosanguineous output in the last 24 hours.  Objective: Vital signs in last 24 hours: Temp:  [97.8 F (36.6 C)-99.7 F (37.6 C)] 98.9 F (37.2 C) (07/06 0526) Pulse Rate:  [71-77] 72 (07/06 0526) Resp:  [16-17] 17 (07/06 0526) BP: (113-124)/(59-67) 113/59 (07/06 0526) SpO2:  [92 %-100 %] 92 % (07/06 0526) Last BM Date : 11/17/22  Intake/Output from previous day: 07/05 0701 - 07/06 0700 In: 1300 [P.O.:100; IV Piggyback:1200] Out: 40 [Drains:40] Intake/Output this shift: No intake/output data recorded.  General appearance: alert, cooperative, and no distress GI: Abdomen soft, moderate distention, no percussion tenderness, right lower quadrant tenderness to palpation; no rigidity, guarding, rebound tenderness; incisions C/D/I with skin staples in place, JP drain in place with serosanguineous output  Lab Results:  Recent Labs    11/20/22 0413 11/21/22 0648  WBC 15.9* 16.3*  HGB 11.0* 11.3*  HCT 33.2* 32.8*  PLT 205 228   BMET Recent Labs    11/19/22 0447 11/21/22 0648  NA 133* 136  K 4.3 3.5  CL 101 102  CO2 25 26  GLUCOSE 120* 96  BUN 14 14  CREATININE 0.73 0.70  CALCIUM 8.1* 8.6*   PT/INR No results for input(s): "LABPROT", "INR" in the last 72 hours.  Studies/Results: DG Abd 1 View  Result Date: 11/20/2022 CLINICAL DATA:  Postoperative ileus. EXAM: ABDOMEN - 1 VIEW COMPARISON:  Abdominopelvic CT 11/18/2022. FINDINGS: 0955 hours. Two supine  views of the abdomen are submitted. There is a new surgical drain in the false pelvis following recent appendectomy. Mild small and large bowel distension consistent with ileus. No evidence of bowel obstruction or perforation. Previous multilevel thoracolumbar spinal augmentation noted. There are lower lung volumes with probable increased atelectasis at both bases. Patient has a known hiatal hernia. IMPRESSION: 1. Mild postoperative ileus. No evidence of bowel obstruction or perforation. 2. Lower lung volumes with probable increased bibasilar atelectasis. Electronically Signed   By: Carey Bullocks M.D.   On: 11/20/2022 13:51    Anti-infectives: Anti-infectives (From admission, onward)    Start     Dose/Rate Route Frequency Ordered Stop   11/20/22 1000  valACYclovir (VALTREX) tablet 500 mg        500 mg Oral 2 times daily 11/20/22 0851     11/18/22 2200  ciprofloxacin (CIPRO) IVPB 400 mg       See Hyperspace for full Linked Orders Report.   400 mg 200 mL/hr over 60 Minutes Intravenous Every 12 hours 11/18/22 1548     11/18/22 2200  metroNIDAZOLE (FLAGYL) IVPB 500 mg       See Hyperspace for full Linked Orders Report.   500 mg 100 mL/hr over 60 Minutes Intravenous Every 12 hours 11/18/22 1548     11/18/22 0915  ciprofloxacin (CIPRO) IVPB 400 mg       See Hyperspace for full Linked Orders Report.   400 mg 200 mL/hr over 60 Minutes Intravenous  Once  11/18/22 0903 11/18/22 1108   11/18/22 0915  metroNIDAZOLE (FLAGYL) IVPB 500 mg       See Hyperspace for full Linked Orders Report.   500 mg 100 mL/hr over 60 Minutes Intravenous  Once 11/18/22 1610 11/18/22 1108       Assessment/Plan:  Patient is a 67 year old female who is status post robotic assisted laparoscopic appendectomy, lysis of adhesions, and partial omentectomy on 7/3.  She was noted to have perforated appendicitis at that time.   -Leukocytosis stable, 16.3 from 15.9 -Continue Cipro and Flagyl -GI soft diet -Given the  significant amount of inflammation and contamination at the time of surgery, suspect that the patient will have a postoperative ileus.  Advised the patient that sometimes it can take several days for her bowels to fully wake up -Continue bowel regimen with Senokot-S and Dulcolax suppositories -Added MiraLAX and magnesium citrate today to see if this helps -Monitor for return of bowel function -Continue scheduled Tylenol, scheduled robaxin, and as needed oxycodone and Dilaudid -Encouraged ambulation -Appreciate hospitalist recommendations   LOS: 3 days    Serrita Lueth A Kaia Depaolis 11/21/2022

## 2022-11-21 NOTE — Progress Notes (Signed)
[facility] PROGRESS NOTE  Carolyn Welch (DOB: June 30, 1955) ZOX:[id number] PCP: Alvina Filbert, MD  Brief Narrative: Carolyn Welch is a 67 y.o. female with a history of GERD, hiatal hernia, hypothyroidism who presented to the ED on 11/18/2022 with abdominal pain found to have acute perforated appendicitis now s/p laparoscopic appendectomy and washout with JP drain placement.   Subjective: Walked around the halls twice last night, but this morning has worse pain than previously, no BM or flatus overnight, some burping but no vomiting.   WBC noted still to be elevated though no fevers, chills. Has been using IS but less over the past day.  Objective: BP (!) 113/59 (BP Location: Left Arm)   Pulse 72   Temp 98.9 F (37.2 C) (Oral)   Resp 17   Ht 5' (1.524 m)   Wt 70.3 kg   SpO2 92%   BMI 30.27 kg/m   Gen: Evident discomfort Pulm: Clear, nonlabored  CV: RRR, no MRG or edema GI: Soft, protuberant with +BS. Quite tender but stable from yesterday with no rebound or guarding. Neuro: Alert and oriented. No new focal deficits. Ext: Warm, no deformities. Skin: No new rashes, lesions or ulcers on visualized skin   Assessment & Plan: Acute perforated appendicitis: s/p robotic-assisted laparoscopic appendectomy, partial omentectomy, LOA 7/3.  - Continue cipro/flagyl. In the absence of hives, bullae, etc. allergy to these medications is less likely. - Pain control > continue with oxycodone and IV dilaudid. Doubt change to any other analgesic that is strong enough to alleviate/moderate pain would bring about any appreciable gains in pruritus. - JP drain and wound management per surgery.  Postoperative ileus:  - Continue bowel regimen. Pt encouraged to use suppositories (has not been) - Exam remains stable, but will be watched very closely. Repeat XR. - Reminded benadryl also can exacerbate constipation.  Atelectasis:  - IS encouraged  Pruritus: Without rash/hives, etc. History of  same with codeine and is taking dilaudid and oxycodone now.  - Continue IV benadryl today since there is ongoing concern for postoperative ileus. No sedation noted at all and this is required to moderate symptoms related to treatment that cannot be changed.   ABLA: Stable no significant hemorrhage currently noted.   HTN: - Restarted home spironolactone at once daily dosing.  Tyrone Nine, MD [facility] [email or web address] 11/21/2022, 10:31 AM

## 2022-11-22 DIAGNOSIS — K3532 Acute appendicitis with perforation and localized peritonitis, without abscess: Secondary | ICD-10-CM | POA: Diagnosis not present

## 2022-11-22 LAB — BASIC METABOLIC PANEL
Anion gap: 12 (ref 5–15)
BUN: 14 mg/dL (ref 8–23)
CO2: 26 mmol/L (ref 22–32)
Calcium: 8.8 mg/dL — ABNORMAL LOW (ref 8.9–10.3)
Chloride: 96 mmol/L — ABNORMAL LOW (ref 98–111)
Creatinine, Ser: 0.62 mg/dL (ref 0.44–1.00)
GFR, Estimated: >60 mL/min (ref >60)
Glucose, Bld: 103 mg/dL — ABNORMAL HIGH (ref 70–99)
Potassium: 3.6 mmol/L (ref 3.5–5.1)
Sodium: 134 mmol/L — ABNORMAL LOW (ref 135–145)

## 2022-11-22 LAB — CBC
HCT: 38.9 % (ref 36.0–46.0)
Hemoglobin: 13.4 g/dL (ref 12.0–15.0)
MCH: 33.8 pg (ref 26.0–34.0)
MCHC: 34.4 g/dL (ref 30.0–36.0)
MCV: 98 fL (ref 80.0–100.0)
Platelets: 282 10*3/uL (ref 150–400)
RBC: 3.97 MIL/uL (ref 3.87–5.11)
RDW: 13.2 % (ref 11.5–15.5)
WBC: 13.6 10*3/uL — ABNORMAL HIGH (ref 4.0–10.5)
nRBC: 0 % (ref 0.0–0.2)

## 2022-11-22 MED ORDER — CIPROFLOXACIN HCL 250 MG PO TABS: 500.0000 mg | ORAL_TABLET | Freq: Two times a day (BID) | ORAL | Status: DC

## 2022-11-22 MED ORDER — POLYETHYLENE GLYCOL 3350 17 G PO PACK: 17.0000 g | PACK | Freq: Every day | ORAL | 0 refills | Status: DC

## 2022-11-22 MED ORDER — METHOCARBAMOL 500 MG PO TABS: 500.0000 mg | ORAL_TABLET | Freq: Three times a day (TID) | ORAL | 0 refills | Status: AC

## 2022-11-22 MED ORDER — ONDANSETRON HCL 4 MG PO TABS: 4.0000 mg | ORAL_TABLET | Freq: Four times a day (QID) | ORAL | 0 refills | Status: DC | PRN

## 2022-11-22 MED ORDER — CIPROFLOXACIN HCL 500 MG PO TABS: 500.0000 mg | ORAL_TABLET | Freq: Two times a day (BID) | ORAL | 0 refills | Status: AC

## 2022-11-22 MED ORDER — METRONIDAZOLE 500 MG PO TABS: 500.0000 mg | ORAL_TABLET | Freq: Two times a day (BID) | ORAL | 0 refills | Status: AC

## 2022-11-22 MED ORDER — OXYCODONE HCL 5 MG PO TABS: 5.0000 mg | ORAL_TABLET | ORAL | 0 refills | Status: DC | PRN

## 2022-11-22 MED ORDER — SENNOSIDES-DOCUSATE SODIUM 8.6-50 MG PO TABS: 1.0000 | ORAL_TABLET | Freq: Two times a day (BID) | ORAL | 0 refills | Status: AC

## 2022-11-22 MED ORDER — METRONIDAZOLE 500 MG PO TABS
500.0000 mg | ORAL_TABLET | Freq: Two times a day (BID) | ORAL | Status: DC
  Administered 2022-11-22: 500 mg via ORAL
  Filled 2022-11-22: qty 1

## 2022-11-22 MED ORDER — ACETAMINOPHEN 500 MG PO TABS: 1000.0000 mg | ORAL_TABLET | Freq: Four times a day (QID) | ORAL | 0 refills | Status: AC

## 2022-11-22 NOTE — Progress Notes (Signed)
[facility] Progress Note  4 Days Post-Op  Subjective: Patient seen and examined.  She is resting comfortably in bed.  States that her abdominal pain and distention is improved.  She started passing flatus, and had a small bowel movement last night and this morning.  She is tolerating her diet without nausea and vomiting.  Her pain is controlled with oral medications.  JP drain in place with 44 cc of serosanguineous output in the last 24 hours.  Objective: Vital signs in last 24 hours: Temp:  [98.4 F (36.9 C)-98.9 F (37.2 C)] 98.9 F (37.2 C) (07/06 2033) Pulse Rate:  [71-78] 78 (07/06 2033) Resp:  [18] 18 (07/06 2033) BP: (127-134)/(66-74) 134/74 (07/06 2033) SpO2:  [95 %-97 %] 95 % (07/06 2033) Last BM Date : 11/17/22  Intake/Output from previous day: 07/06 0701 - 07/07 0700 In: 0  Out: 44 [Drains:44] Intake/Output this shift: No intake/output data recorded.  General appearance: alert, cooperative, and no distress GI: Abdomen soft, moderate distention, no percussion tenderness, mild right lower quadrant tenderness to palpation; no rigidity, guarding, rebound tenderness; incisions C/D/I with skin staples in place, JP drain in place with serosanguineous output  Lab Results:  Recent Labs    11/21/22 0648 11/22/22 0727  WBC 16.3* 13.6*  HGB 11.3* 13.4  HCT 32.8* 38.9  PLT 228 282   BMET Recent Labs    11/21/22 0648 11/22/22 0727  NA 136 134*  K 3.5 3.6  CL 102 96*  CO2 26 26  GLUCOSE 96 103*  BUN 14 14  CREATININE 0.70 0.62  CALCIUM 8.6* 8.8*   PT/INR No results for input(s): "LABPROT", "INR" in the last 72 hours.  Studies/Results: DG Abd 1 View  Result Date: 11/21/2022 CLINICAL DATA:  [id number] Postoperative ileus ([facility]) [id number] EXAM: ABDOMEN - 1 VIEW COMPARISON:  11/20/2022. FINDINGS: Surgical drain remains positioned within the pelvis. Multiple mildly dilated loops of small bowel throughout the abdomen, similar in caliber to prior. No large  volume pneumoperitoneum. IMPRESSION: Multiple mildly dilated loops of small bowel throughout the abdomen, similar in caliber to prior. Appearance favors postoperative ileus. Electronically Signed   By: Duanne Guess D.O.   On: 11/21/2022 13:18    Anti-infectives: Anti-infectives (From admission, onward)    Start     Dose/Rate Route Frequency Ordered Stop   11/22/22 2000  ciprofloxacin (CIPRO) tablet 500 mg        500 mg Oral 2 times daily 11/22/22 1018     11/22/22 1200  metroNIDAZOLE (FLAGYL) tablet 500 mg        500 mg Oral Every 12 hours 11/22/22 1017     11/20/22 1000  valACYclovir (VALTREX) tablet 500 mg        500 mg Oral 2 times daily 11/20/22 0851     11/18/22 2200  ciprofloxacin (CIPRO) IVPB 400 mg  Status:  Discontinued       See Hyperspace for full Linked Orders Report.   400 mg 200 mL/hr over 60 Minutes Intravenous Every 12 hours 11/18/22 1548 11/22/22 1017   11/18/22 2200  metroNIDAZOLE (FLAGYL) IVPB 500 mg  Status:  Discontinued       See Hyperspace for full Linked Orders Report.   500 mg 100 mL/hr over 60 Minutes Intravenous Every 12 hours 11/18/22 1548 11/22/22 1017   11/18/22 0915  ciprofloxacin (CIPRO) IVPB 400 mg       See Hyperspace for full Linked Orders Report.   400 mg 200 mL/hr over 60 Minutes Intravenous  Once 11/18/22 0903 11/18/22 1108   11/18/22 0915  metroNIDAZOLE (FLAGYL) IVPB 500 mg       See Hyperspace for full Linked Orders Report.   500 mg 100 mL/hr over 60 Minutes Intravenous  Once 11/18/22 4098 11/18/22 1108       Assessment/Plan:  Patient is a 67 year old female who is status post robotic assisted laparoscopic appendectomy, lysis of adhesions, and partial omentectomy on 7/3.  She was noted to have perforated appendicitis at that time.   -Leukocytosis improving today, 13.6 from 16.3 -Continue Cipro and Flagyl, switch to oral antibiotics.  Patient will need a total of 5 days after surgery of treatment -GI soft diet -Patient's postoperative  ileus seems to be slightly resolving, as she had a couple of small bowel movements and is now passing flatus -Continue bowel regimen with Senokot-S, MiraLAX, and Dulcolax suppositories -Monitor bowel function -Continue scheduled Tylenol, scheduled robaxin, and as needed oxycodone -Encouraged ambulation -Patient stable for discharge from a general surgery standpoint.  She will follow-up with me on Thursday for evaluation of JP drain removal   LOS: 4 days    Icy Fuhrmann A Zonnie Landen 11/22/2022

## 2022-11-22 NOTE — Discharge Summary (Signed)
Physician Discharge Summary   Patient: Vedha Cata MRN: [id number] DOB: 04/08/1956  Admit date:     11/18/2022  Discharge date: 11/22/22  Discharge Physician: Tyrone Nine   PCP: Alvina Filbert, MD   Recommendations at discharge:  Follow up with general surgery for care post laparoscopic appendectomy.  Discharge Diagnoses: Principal Problem:   Acute appendicitis Active Problems:   Gastroesophageal reflux disease   Hiatal hernia   Hypothyroidism   HTN (hypertension)  Hospital Course: Shaydee Cieslik is a 67 y.o. female with a history of GERD, hiatal hernia, hypothyroidism who presented to the ED on 11/18/2022 with abdominal pain found to have acute perforated appendicitis now s/p laparoscopic appendectomy and washout with JP drain placement. Postoperative ileus was treated supportively, resolving prior to discharge. Will complete 5 days of antibiotics the day after discharge.  Assessment and Plan: Acute perforated appendicitis: s/p robotic-assisted laparoscopic appendectomy, partial omentectomy, LOA 7/3.  - Continue cipro/flagyl, change to po and complete 5 days postoperatively per surgery. Prescribed at DC. Appears to tolerate these abx well.  - Pain control > prescribed by surgery.  - JP drain and wound management per surgery at follow up.   Postoperative ileus: Resolved. Continue bowel regimen.     Atelectasis:  - IS encouraged even after discharge.   Pruritus: Without rash/hives, etc. History of same with codeine so suspect this is opioid-related.    ABLA: Stable no significant hemorrhage currently noted. Hgb 13.4 on day of discharge.   HTN: - Continue home spironolactone  Consultants: Surgery Procedures performed:  11/18/22 XI ROBOTIC LAPAROSCOPIC ASSISTED APPENDECTOMY WITH PARTIAL OMENTECTOMY Pappayliou, Gustavus Messing, DO  Disposition: Home Diet recommendation:  Discharge Diet Orders (From admission, onward)     Start     Ordered   11/22/22 0000  Diet - low sodium  heart healthy        11/22/22 1055          DISCHARGE MEDICATION: Allergies as of 11/22/2022       Reactions   Alpha-gal Anaphylaxis, Swelling   Cephalosporins Hives   Clindamycin Hives   Codeine Itching   Doxycycline Hives   Sulfa Antibiotics    Redness         Medication List     STOP taking these medications    acetaminophen 650 MG CR tablet Commonly known as: TYLENOL Replaced by: acetaminophen 500 MG tablet       TAKE these medications    acetaminophen 500 MG tablet Commonly known as: TYLENOL Take 2 tablets (1,000 mg total) by mouth every 6 (six) hours for 7 days. Replaces: acetaminophen 650 MG CR tablet   calcium carbonate 750 MG chewable tablet Commonly known as: TUMS EX Chew 1 tablet (750 mg total) by mouth 2 (two) times daily. As needed for heartburn What changed:  how much to take when to take this reasons to take this   ciprofloxacin 500 MG tablet Commonly known as: CIPRO Take 1 tablet (500 mg total) by mouth 2 (two) times daily for 1 day.   diphenhydrAMINE 25 MG tablet Commonly known as: BENADRYL Take 50 mg by mouth every 6 (six) hours as needed for allergies.   EPINEPHrine 0.3 mg/0.3 mL Soaj injection Commonly known as: EPI-PEN Inject 0.3 mg into the muscle as needed for anaphylaxis.   Gaviscon 80-14.2 MG Chew Generic drug: Alum Hydroxide-Mag Trisilicate Chew 2 tablets by mouth daily as needed (indigestion).   levothyroxine 100 MCG tablet Commonly known as: SYNTHROID Take 100 mcg by mouth daily before  breakfast.   loratadine 10 MG tablet Commonly known as: CLARITIN Take 10 mg by mouth daily.   methocarbamol 500 MG tablet Commonly known as: ROBAXIN Take 1 tablet (500 mg total) by mouth 3 (three) times daily for 7 days.   metroNIDAZOLE 500 MG tablet Commonly known as: FLAGYL Take 1 tablet (500 mg total) by mouth every 12 (twelve) hours for 1 day.   mupirocin ointment 2 % Commonly known as: BACTROBAN Apply 1 Application  topically 3 (three) times daily as needed (rosacea).   omeprazole 20 MG tablet Commonly known as: PRILOSEC OTC Take 1 tablet (20 mg total) by mouth 2 (two) times daily. What changed: when to take this   ondansetron 4 MG tablet Commonly known as: ZOFRAN Take 1 tablet (4 mg total) by mouth every 6 (six) hours as needed for nausea.   oxyCODONE 5 MG immediate release tablet Commonly known as: Oxy IR/ROXICODONE Take 1 tablet (5 mg total) by mouth every 4 (four) hours as needed for moderate pain.   polyethylene glycol 17 g packet Commonly known as: MIRALAX / GLYCOLAX Take 17 g by mouth daily. Start taking on: November 23, 2022   senna-docusate 8.6-50 MG tablet Commonly known as: Senokot-S Take 1 tablet by mouth 2 (two) times daily for 7 days.   spironolactone 100 MG tablet Commonly known as: ALDACTONE Take 100 mg by mouth 2 (two) times daily.   traZODone 100 MG tablet Commonly known as: DESYREL Take 100 mg by mouth at bedtime.   Valtrex 500 MG tablet Generic drug: valACYclovir Take 500 mg by mouth 2 (two) times daily.        Follow-up Information     Pappayliou, Gustavus Messing, DO. Call.   Specialty: General Surgery Why: Call to schedule follow-up appointment in 2 weeks for staple removal Contact information: [address] [facility] [address] [phone]         Surgery, [place]. Call.   Specialty: General Surgery Why: Call to schedule an appointment to discuss hiatal hernia repair Contact information: [address] [place] [address] [phone]         Alvina Filbert, MD Follow up.   Specialty: Internal Medicine Contact information: [address] [place] [address] [phone]                Discharge Exam: Ceasar Mons Weights   11/18/22 0644  Weight: 70.3 kg  BP 134/74 (BP Location: Left Arm)   Pulse 78   Temp 98.9 F (37.2 C) (Oral)   Resp 18   Ht 5' (1.524 m)   Wt 70.3 kg   SpO2 95%   BMI 30.27 kg/m    Well-appearing female in no distress. Has been walking the halls, having flatus, had a BM overnight, lost IV access and wants to go home. Tolerating diet, no fevers. RRR, no MRG Clear, nonlabored +BS, soft, protuberant, appropriately tender without guarding  Condition at discharge: stable  The results of significant diagnostics from this hospitalization (including imaging, microbiology, ancillary and laboratory) are listed below for reference.   Imaging Studies: DG Abd 1 View  Result Date: 11/21/2022 CLINICAL DATA:  233005 Postoperative ileus (HCC) 244010 EXAM: ABDOMEN - 1 VIEW COMPARISON:  11/20/2022. FINDINGS: Surgical drain remains positioned within the pelvis. Multiple mildly dilated loops of small bowel throughout the abdomen, similar in caliber to prior. No large volume pneumoperitoneum. IMPRESSION: Multiple mildly dilated loops of small bowel throughout the abdomen, similar in caliber to prior. Appearance favors postoperative  ileus. Electronically Signed   By: Duanne Guess D.O.   On: 11/21/2022 13:18   DG Abd 1 View  Result Date: 11/20/2022 CLINICAL DATA:  Postoperative ileus. EXAM: ABDOMEN - 1 VIEW COMPARISON:  Abdominopelvic CT 11/18/2022. FINDINGS: 0955 hours. Two supine views of the abdomen are submitted. There is a new surgical drain in the false pelvis following recent appendectomy. Mild small and large bowel distension consistent with ileus. No evidence of bowel obstruction or perforation. Previous multilevel thoracolumbar spinal augmentation noted. There are lower lung volumes with probable increased atelectasis at both bases. Patient has a known hiatal hernia. IMPRESSION: 1. Mild postoperative ileus. No evidence of bowel obstruction or perforation. 2. Lower lung volumes with probable increased bibasilar atelectasis. Electronically Signed   By: Carey Bullocks M.D.   On: 11/20/2022 13:51   CT ABDOMEN PELVIS W CONTRAST  Result Date: 11/18/2022 CLINICAL DATA:  Right-sided  abdominal pain for 1 day. EXAM: CT ABDOMEN AND PELVIS WITH CONTRAST TECHNIQUE: Multidetector CT imaging of the abdomen and pelvis was performed using the standard protocol following bolus administration of intravenous contrast. RADIATION DOSE REDUCTION: This exam was performed according to the departmental dose-optimization program which includes automated exposure control, adjustment of the mA and/or kV according to patient size and/or use of iterative reconstruction technique. CONTRAST:  OMNIPAQUE IOHEXOL 300 MG/ML  SOLN COMPARISON:  None Available. FINDINGS: Lower chest: Hyperdense material which appears to be within the left lower lobe vasculature may reflect embolized cement material. The imaged lung bases are otherwise unremarkable. The imaged heart is unremarkable. Hepatobiliary: The liver and gallbladder are unremarkable. There is no biliary ductal dilatation. Pancreas: Unremarkable. Spleen: Unremarkable. Adrenals/Urinary Tract: The adrenals are unremarkable. The kidneys are unremarkable, with no focal lesion, stone, hydronephrosis, or hydroureter. There is symmetric excretion of contrast into the collecting systems on the delayed images. The bladder is unremarkable. Stomach/Bowel: There is a moderate-sized hiatal hernia with fluid in the distal esophagus likely reflecting reflux. The stomach is otherwise unremarkable. There is no evidence of bowel obstruction. There is background colonic diverticulosis. The appendix is dilated and inflamed measuring up to 1.2 cm in diameter with multiple appendicoliths at the base. There is wall thickening in the adjacent distal ileum as well as the: Which is favored to reflect reactive inflammatory change. There is surrounding free fluid in the right lower quadrant, left paracolic gutter, and pelvis but no evidence of organized or drainable abscess. There is no free intraperitoneal air to suggest frank perforation. Vascular/Lymphatic: There is calcified plaque in the  nonaneurysmal abdominal aorta. The major branch vessels are patent. The main portal and splenic veins are patent. There is no abdominal or pelvic lymphadenopathy. Reproductive: The uterus and adnexa are unremarkable. Other: There is a small fat containing umbilical hernia. Musculoskeletal: There is no acute osseous abnormality or suspicious osseous lesion. The patient is status post vertebral augmentation at T7, L1, and L5. There are age-indeterminate compression fractures of the T11, T12, and L2 through L4 vertebral bodies. There is no bony retropulsion at any level. IMPRESSION: 1. Acute appendicitis with surrounding free fluid but no evidence of organized or drainable abscess or free intraperitoneal air. Multiple appendicoliths are noted at the base of the appendix. 2. Probable reactive terminal ileitis and colitis. 3. Moderate-sized hiatal hernia with fluid in the distal esophagus likely reflecting reflux. 4. Diverticulosis. 5. Status post vertebral augmentation at T7, L1, and L5 with a probable small amount of embolized cement material in the left lower lobe. 6. Age-indeterminate compression fractures  of the T11, T12, and L2 through L4 vertebral bodies. Electronically Signed   By: Lesia Hausen M.D.   On: 11/18/2022 08:57    Microbiology: Results for orders placed or performed during the hospital encounter of 09/06/20  SARS CORONAVIRUS 2 (TAT 6-24 HRS) Nasopharyngeal Nasopharyngeal Swab     Status: None   Collection Time: 09/06/20  8:57 AM   Specimen: Nasopharyngeal Swab  Result Value Ref Range Status   SARS Coronavirus 2 NEGATIVE NEGATIVE Final    Comment: (NOTE) SARS-CoV-2 target nucleic acids are NOT DETECTED.  The SARS-CoV-2 RNA is generally detectable in upper and lower respiratory specimens during the acute phase of infection. Negative results do not preclude SARS-CoV-2 infection, do not rule out co-infections with other pathogens, and should not be used as the sole basis for treatment or  other patient management decisions. Negative results must be combined with clinical observations, patient history, and epidemiological information. The expected result is Negative.  Fact Sheet for Patients: HairSlick.no  Fact Sheet for Healthcare Providers: quierodirigir.com  This test is not yet approved or cleared by the [place] FDA and  has been authorized for detection and/or diagnosis of SARS-CoV-2 by FDA under an Emergency Use Authorization (EUA). This EUA will remain  in effect (meaning this test can be used) for the duration of the COVID-19 declaration under Se ction 564(b)(1) of the Act, 21 U.S.C. section 360bbb-3(b)(1), unless the authorization is terminated or revoked sooner.  Performed at [facility], [address], [place], [place] [address]     Labs: CBC: Recent Labs  Lab 11/18/22 0700 11/19/22 0447 11/20/22 0413 11/21/22 0648 11/22/22 0727  WBC 9.0 13.1* 15.9* 16.3* 13.6*  HGB 14.4 11.5* 11.0* 11.3* 13.4  HCT 41.6 33.9* 33.2* 32.8* 38.9  MCV 98.1 100.6* 102.8* 98.2 98.0  PLT 262 208 205 228 282   Basic Metabolic Panel: Recent Labs  Lab 11/18/22 0700 11/19/22 0447 11/21/22 0648 11/22/22 0727  NA 133* 133* 136 134*  K 3.8 4.3 3.5 3.6  CL 98 101 102 96*  CO2 25 25 26 26   GLUCOSE 151* 120* 96 103*  BUN 25* 14 14 14   CREATININE 0.74 0.73 0.70 0.62  CALCIUM 9.0 8.1* 8.6* 8.8*   Liver Function Tests: Recent Labs  Lab 11/18/22 0700  AST 24  ALT 21  ALKPHOS 51  BILITOT 1.2  PROT 7.1  ALBUMIN 4.0   CBG: No results for input(s): "GLUCAP" in the last 168 hours.  Discharge time spent: greater than 30 minutes.  Signed: Tyrone Nine, MD [facility] 11/22/2022

## 2022-11-23 ENCOUNTER — Encounter (HOSPITAL_COMMUNITY): Payer: Self-pay | Admitting: Surgery

## 2022-11-26 ENCOUNTER — Encounter: Payer: Self-pay | Admitting: Surgery

## 2022-11-26 ENCOUNTER — Ambulatory Visit (INDEPENDENT_AMBULATORY_CARE_PROVIDER_SITE_OTHER): Payer: Medicare Other | Admitting: Surgery

## 2022-11-26 VITALS — BP 128/74 | HR 84 | Temp 97.8°F | Resp 14 | Ht 60.0 in | Wt 153.0 lb

## 2022-11-26 DIAGNOSIS — Z09 Encounter for follow-up examination after completed treatment for conditions other than malignant neoplasm: Secondary | ICD-10-CM

## 2022-11-26 MED ORDER — FLUCONAZOLE 150 MG PO TABS: 150.0000 mg | ORAL_TABLET | Freq: Once | ORAL | 0 refills | Status: AC

## 2022-11-27 NOTE — Progress Notes (Addendum)
[facility] Note   HPI:  67 y.o. Female presents to clinic for post-op follow-up who presents for follow-up status post robotic assisted laparoscopic appendectomy for perforated appendicitis on 7/3.  Since being discharged home from the hospital, the patient has been doing well.  She has had loose bowel movements, though she has been taking Senokot S.  She has since stopped taking her Senokot as.  She confirms passing large volumes of flatus.  Her abdominal pain is continuing to improve.  JP drain is putting out minimal serosanguineous drainage.  She is tolerating her diet without nausea and vomiting.  Denies fevers and chills.  Review of Systems:  All other review of systems: otherwise negative   Vital Signs:  BP 128/74   Pulse 84   Temp 97.8 F (36.6 C) (Oral)   Resp 14   Ht 5' (1.524 m)   Wt 153 lb (69.4 kg)   SpO2 98%   BMI 29.88 kg/m    Physical Exam:  Physical Exam Vitals reviewed.  Constitutional:      Appearance: Normal appearance.  Abdominal:     Comments: Abdomen soft, improved distention, no percussion tenderness, minimal to no tenderness to palpation; no rigidity, guarding, rebound tenderness; laparoscopic incision sites in the right side of the abdomen are C/D/I with skin staples in place, JP drain in left lower quadrant with serosanguineous output  Neurological:     Mental Status: She is alert.     Laboratory studies: None  Imaging:  None  Pathology: A. APPENDIX, AND PARTIAL OMENTUM, APPENDECTOMY:  - Acute appendicitis with serositis   Assessment:  67 y.o. yo Female who presents for follow-up status post robotic assisted laparoscopic appendectomy for perforated appendicitis on 7/3  Plan:  -Patient is overall doing well from a surgical standpoint.  Pain well-controlled, tolerating a diet, moving her bowels -JP drain was removed in office -Patient will present to the office next week for removal of her skin staples -Discussed her  pathology results -Prescription for Diflucan sent in for patient -Follow up with me as needed  All of the above recommendations were discussed with the patient and patient's family, and all of patient's and family's questions were answered to their expressed satisfaction.  Theophilus Kinds, DO [facility] [address] [place], [place] [address] [phone] (office)

## 2022-12-03 ENCOUNTER — Encounter: Payer: Medicare Other | Admitting: Internal Medicine

## 2022-12-23 ENCOUNTER — Ambulatory Visit: Payer: Medicare Other | Admitting: Internal Medicine

## 2022-12-30 ENCOUNTER — Ambulatory Visit: Payer: Medicare Other | Admitting: Internal Medicine

## 2023-12-07 ENCOUNTER — Ambulatory Visit

## 2023-12-07 ENCOUNTER — Ambulatory Visit
Admission: RE | Admit: 2023-12-07 | Discharge: 2023-12-07 | Disposition: A | Discharge status: Home / Self Care | Source: Ambulatory Visit | Attending: Family Medicine | Admitting: Family Medicine

## 2023-12-07 VITALS — BP 119/66 | HR 71 | Temp 98.6°F | Resp 16

## 2023-12-07 DIAGNOSIS — J0100 Acute maxillary sinusitis, unspecified: Secondary | ICD-10-CM

## 2023-12-07 DIAGNOSIS — R102 Pelvic and perineal pain: Secondary | ICD-10-CM

## 2023-12-07 LAB — POCT URINALYSIS DIP (MANUAL ENTRY)
Bilirubin, UA: NEGATIVE
Blood, UA: NEGATIVE
Glucose, UA: NEGATIVE mg/dL
Ketones, POC UA: NEGATIVE mg/dL
Leukocytes, UA: NEGATIVE
Nitrite, UA: NEGATIVE
Protein Ur, POC: NEGATIVE mg/dL
Spec Grav, UA: <=1.005 — AB (ref 1.010–1.025)
Urobilinogen, UA: 0.2 U/dL
pH, UA: 6 (ref 5.0–8.0)

## 2023-12-07 MED ORDER — AMOXICILLIN-POT CLAVULANATE 875-125 MG PO TABS: 1.0000 | ORAL_TABLET | Freq: Two times a day (BID) | ORAL | 0 refills | Status: DC

## 2023-12-07 MED ORDER — FLUCONAZOLE 150 MG PO TABS: 150.0000 mg | ORAL_TABLET | ORAL | 0 refills | Status: DC

## 2023-12-07 MED ORDER — PREDNISONE 20 MG PO TABS: 40.0000 mg | ORAL_TABLET | Freq: Every day | ORAL | 0 refills | Status: DC

## 2023-12-07 MED ORDER — AZELASTINE HCL 0.1 % NA SOLN: 1.0000 | Freq: Two times a day (BID) | NASAL | 0 refills | Status: DC

## 2023-12-07 NOTE — ED Triage Notes (Signed)
 Pt reports sinus pressure, sinus swelling, discomfort for months has tried OTC meds but has found no relief. Urinary discomfort x 2 weeks.

## 2023-12-07 NOTE — ED Provider Notes (Signed)
 [facility]    CSN: [id number] Arrival date & time: 12/07/23  9046      History   Chief Complaint Chief Complaint  Patient presents with   Nasal Congestion    I have a sinus infection and probably a bladder infection - Entered by patient    HPI Carolyn Welch is a 68 y.o. female.   Patient presenting today with 3-month history of progressively worsening sinus pain and pressure, nasal congestion, sinus headache, fatigue, nausea.  States past history of sinus infections, and history of seasonal allergies on Claritin .  Has been trying sinus rinses, Mucinex DM, Afrin here in the ER with minimal relief.  Also having some suprapubic pressure for about 2 weeks.  Denies dysuria, hematuria, nausea, vomiting, flank pain.  So far not tried any over-the-counter for symptoms.  Does endorse when asked drinking more beer and soft drinks than usual.    Past Medical History:  Diagnosis Date   Allergy to alpha-gal    Constipation    Fibromyalgia    GERD (gastroesophageal reflux disease)    Hayfever    Hyperlipidemia    Hypothyroidism     Patient Active Problem List   Diagnosis Date Noted   Acute appendicitis 11/18/2022   Hypothyroidism 11/18/2022   HTN (hypertension) 11/18/2022   Hiatal hernia 09/09/2022   Abdominal bloating 07/24/2021   Facial swelling 07/24/2021   Pain of upper abdomen 02/17/2021   Gastroesophageal reflux disease 07/18/2020   Constipation 07/18/2020   History of colonic polyps 07/18/2020    Past Surgical History:  Procedure Laterality Date   BALLOON DILATION N/A 03/11/2021   Procedure: BALLOON DILATION;  Surgeon: Cindie Carlin POUR, DO;  Location: [facility];  Service: Endoscopy;  Laterality: N/A;   BALLOON DILATION N/A 02/02/2022   Procedure: BALLOON DILATION;  Surgeon: Cindie Carlin POUR, DO;  Location: [facility];  Service: Endoscopy;  Laterality: N/A;   BIOPSY  03/11/2021   Procedure: BIOPSY;  Surgeon: Cindie Carlin POUR, DO;  [facility]: [facility];  Service: Endoscopy;;   BIOPSY  02/02/2022   Procedure: BIOPSY;  Surgeon: Cindie Carlin POUR, DO;  [facility]: [facility];  Service: Endoscopy;;  gastric   CARPAL TUNNEL RELEASE Bilateral    CATARACT EXTRACTION Left    CESAREAN SECTION     x3   COLONOSCOPY WITH PROPOFOL  N/A 09/09/2020   Surgeon: Cindie Carlin POUR, DO;  Nonbleeding internal hemorrhoids, diverticulosis in the sigmoid and descending colon.  Repeat in 10 years.   ESOPHAGOGASTRODUODENOSCOPY (EGD) WITH PROPOFOL  N/A 03/11/2021   Surgeon: Cindie Carlin POUR, DO; small hiatal hernia, grade C reflux esophagitis, benign-appearing esophageal stenosis dilated, gastritis biopsied. Pathology with reactive gastropathy with intestinal metaplasia, negative for H. pylori.   ESOPHAGOGASTRODUODENOSCOPY (EGD) WITH PROPOFOL  N/A 02/02/2022   Procedure: ESOPHAGOGASTRODUODENOSCOPY (EGD) WITH PROPOFOL ;  Surgeon: Cindie Carlin POUR, DO;  [facility]: [facility];  Service: Endoscopy;  Laterality: N/A;  8:45am   ROBOTIC ASSISTED LAPAROSCOPIC LYSIS OF ADHESION  11/18/2022   Procedure: XI ROBOTIC ASSISTED LAPAROSCOPIC LYSIS OF ADHESION;  Surgeon: Evonnie Dorothyann LABOR, DO;  Location: [facility];  Service: General;;   SINUS EXPLORATION     TONSILLECTOMY     XI ROBOTIC LAPAROSCOPIC ASSISTED APPENDECTOMY N/A 11/18/2022   Procedure: XI ROBOTIC LAPAROSCOPIC ASSISTED APPENDECTOMY WITH PARTIAL OMENTECTOMY;  Surgeon: Evonnie Dorothyann LABOR, DO;  Location: [facility];  Service: General;  Laterality: N/A;    OB History   No obstetric history on file.      Home Medications  Prior to Admission medications   Medication Sig Start Date End Date Taking? Authorizing Provider  amoxicillin -clavulanate (AUGMENTIN ) 875-125 MG tablet Take 1 tablet by mouth every 12 (twelve) hours. 12/07/23  Yes Stuart Vernell Norris, PA-C  azelastine  (ASTELIN ) 0.1 % nasal spray Place 1 spray into both nostrils 2 (two) times daily. Use in each nostril as directed 12/07/23  Yes Stuart Vernell Norris, PA-C  fluconazole  (DIFLUCAN ) 150 MG tablet Take 1 tablet (150 mg total) by mouth once a week. 12/07/23  Yes Stuart Vernell Norris, PA-C  predniSONE  (DELTASONE ) 20 MG tablet Take 2 tablets (40 mg total) by mouth daily with breakfast. 12/07/23  Yes Stuart Vernell Norris, PA-C  Alum Hydroxide-Mag Trisilicate (GAVISCON) 80-14.2 MG CHEW Chew 2 tablets by mouth daily as needed (indigestion).    [provider]  calcium  carbonate (TUMS EX) 750 MG chewable tablet Chew 1 tablet (750 mg total) by mouth 2 (two) times daily. As needed for heartburn Patient taking differently: Chew 2 tablets by mouth as needed. As needed for heartburn 02/02/22   Cindie Carlin POUR, DO  diphenhydrAMINE  (BENADRYL ) 25 MG tablet Take 50 mg by mouth every 6 (six) hours as needed for allergies.    [provider]  EPINEPHrine 0.3 mg/0.3 mL IJ SOAJ injection Inject 0.3 mg into the muscle as needed for anaphylaxis. 09/26/20   [provider]  levothyroxine  (SYNTHROID ) 100 MCG tablet Take 100 mcg by mouth daily before breakfast.    [provider]  loratadine  (CLARITIN ) 10 MG tablet Take 10 mg by mouth daily.    [provider]  mupirocin ointment (BACTROBAN) 2 % Apply 1 Application topically 3 (three) times daily as needed (rosacea). 10/28/21   [provider]  omeprazole  (PRILOSEC  OTC) 20 MG tablet Take 1 tablet (20 mg total) by mouth 2 (two) times daily. Patient taking differently: Take 20 mg by mouth daily. 02/02/22 02/02/23  Cindie Carlin POUR, DO  ondansetron  (ZOFRAN ) 4 MG tablet Take 1 tablet (4 mg total) by mouth every 6 (six) hours as needed for nausea. 11/22/22   Pappayliou, Dorothyann A, DO  oxyCODONE  (OXY IR/ROXICODONE ) 5 MG immediate release tablet Take 1 tablet (5 mg total) by mouth every 4 (four) hours as needed for moderate pain. Patient not taking: Reported on 11/26/2022 11/22/22   Pappayliou, Dorothyann A, DO  polyethylene glycol (MIRALAX  / GLYCOLAX ) 17 g packet  Take 17 g by mouth daily. 11/23/22   Pappayliou, Dorothyann A, DO  spironolactone  (ALDACTONE ) 100 MG tablet Take 100 mg by mouth 2 (two) times daily. 06/26/20   [provider]  traZODone  (DESYREL ) 100 MG tablet Take 100 mg by mouth at bedtime.    [provider]  VALTREX  500 MG tablet Take 500 mg by mouth 2 (two) times daily. 02/18/21   [provider]    Family History Family History  Problem Relation Age of Onset   Cancer Mother    Hypertension Mother    Multiple sclerosis Father    Esophageal cancer Maternal Aunt    Colon cancer Paternal Grandmother        diagnosed in her 59s    Social History Social History   Tobacco Use   Smoking status: Former    Current packs/day: 0.50    Average packs/day: 0.5 packs/day for 13.0 years (6.5 ttl pk-yrs)    Types: Cigarettes   Smokeless tobacco: Never  Vaping Use   Vaping status: Never Used  Substance Use Topics   Alcohol use: Yes  Comment: socially   Drug use: Never     Allergies   Alpha-gal, Cephalosporins, Clindamycin, Codeine, Doxycycline, and Sulfa antibiotics   Review of Systems Review of Systems Per HPI  Physical Exam Triage Vital Signs ED Triage Vitals  Encounter Vitals Group     BP 12/07/23 1014 119/66     Girls Systolic BP Percentile --      Girls Diastolic BP Percentile --      Boys Systolic BP Percentile --      Boys Diastolic BP Percentile --      Pulse Rate 12/07/23 1014 71     Resp 12/07/23 1014 16     Temp 12/07/23 1014 98.6 F (37 C)     Temp Source 12/07/23 1014 Oral     SpO2 12/07/23 1014 95 %     Weight --      Height --      Head Circumference --      Peak Flow --      Pain Score 12/07/23 1018 5     Pain Loc --      Pain Education --      Exclude from Growth Chart --    No data found.  Updated Vital Signs BP 119/66 (BP Location: Right Arm)   Pulse 71   Temp 98.6 F (37 C) (Oral)   Resp 16   SpO2 95%   Visual Acuity Right Eye Distance:   Left Eye  Distance:   Bilateral Distance:    Right Eye Near:   Left Eye Near:    Bilateral Near:     Physical Exam Vitals and nursing note reviewed.  Constitutional:      Appearance: Normal appearance.  HENT:     Head: Atraumatic.     Right Ear: Tympanic membrane and external ear normal.     Left Ear: Tympanic membrane and external ear normal.     Nose: Congestion present.     Mouth/Throat:     Mouth: Mucous membranes are moist.     Pharynx: Posterior oropharyngeal erythema present.  Eyes:     Extraocular Movements: Extraocular movements intact.     Conjunctiva/sclera: Conjunctivae normal.  Cardiovascular:     Rate and Rhythm: Normal rate and regular rhythm.     Heart sounds: Normal heart sounds.  Pulmonary:     Effort: Pulmonary effort is normal.     Breath sounds: Normal breath sounds. No wheezing.  Abdominal:     General: Bowel sounds are normal. There is no distension.     Palpations: Abdomen is soft.     Tenderness: There is no abdominal tenderness. There is no right CVA tenderness, left CVA tenderness or guarding.  Musculoskeletal:        General: Normal range of motion.     Cervical back: Normal range of motion and neck supple.  Skin:    General: Skin is warm and dry.  Neurological:     Mental Status: She is alert and oriented to person, place, and time.  Psychiatric:        Mood and Affect: Mood normal.        Thought Content: Thought content normal.      UC Treatments / Results  Labs (all labs ordered are listed, but only abnormal results are displayed) Labs Reviewed  POCT URINALYSIS DIP (MANUAL ENTRY) - Abnormal; Notable for the following components:      Result Value   Color, UA yellow (*)    Spec Grav, UA <=  1.005 (*)    All other components within normal limits    EKG   Radiology No results found.  Procedures Procedures (including critical care time)  Medications Ordered in UC Medications - No data to display  Initial Impression / Assessment and  Plan / UC Course  I have reviewed the triage vital signs and the nursing notes.  Pertinent labs & imaging results that were available during my care of the patient were reviewed by me and considered in my medical decision making (see chart for details).     Given duration and worsening course, will treat with Augmentin  which she states that she does very well with despite Keflex allergy, prednisone , Astelin , antihistamine regimen, supportive home care.  No evidence of a urinary tract infection today, discussed discontinuing caffeinated or sugary beverages and increasing water  intake, monitoring for improvement in symptoms.  Final Clinical Impressions(s) / UC Diagnoses   Final diagnoses:  Acute non-recurrent maxillary sinusitis  Suprapubic pressure   Discharge Instructions   None    ED Prescriptions     Medication Sig Dispense Auth. Provider   predniSONE  (DELTASONE ) 20 MG tablet Take 2 tablets (40 mg total) by mouth daily with breakfast. 10 tablet Stuart Vernell Norris, PA-C   amoxicillin -clavulanate (AUGMENTIN ) 875-125 MG tablet Take 1 tablet by mouth every 12 (twelve) hours. 14 tablet Stuart Vernell Norris, PA-C   azelastine  (ASTELIN ) 0.1 % nasal spray Place 1 spray into both nostrils 2 (two) times daily. Use in each nostril as directed 30 mL Stuart Vernell Norris, PA-C   fluconazole  (DIFLUCAN ) 150 MG tablet Take 1 tablet (150 mg total) by mouth once a week. 2 tablet Stuart Vernell Norris, [place]      PDMP not reviewed this encounter.   Stuart Vernell Norris, [place] 12/07/23 1103

## 2024-03-01 ENCOUNTER — Encounter (INDEPENDENT_AMBULATORY_CARE_PROVIDER_SITE_OTHER): Payer: Self-pay | Admitting: Gastroenterology

## 2024-04-26 ENCOUNTER — Ambulatory Visit: Payer: Self-pay

## 2024-04-26 NOTE — Telephone Encounter (Signed)
 FYI Only or Action Required?: Action required by provider: request for appointment. Needs TOC for Dr Duanne, husband sees him. UC or visiting old PCP advised.   Called Nurse Triage reporting Shoulder Pain.  Symptoms began several months ago.  Interventions attempted: OTC medications: tylenol .  Symptoms are: unchanged.  Triage Disposition: See PCP Within 2 Weeks, Home Care  Patient/caregiver understands and will follow disposition?: Yes  Copied from CRM #[id number]. Topic: Clinical - Red Word Triage >> Apr 26, 2024  3:25 PM Carolyn Welch wrote: Red Word that prompted transfer to Nurse Triage: Establish care; hurt shoulders in a moving process that is affecting daily life activities, has trouble lifting arms up, has a hard time sleeping at night due to pain, lower back issues; pt has fibromylagia  History of Dislocated shoulder years ago Reason for Disposition  Caused by overuse from recent vigorous activity (e.g., tennis, other sports, work involving overhead lifting)  Shoulder pain is a chronic symptom (recurrent or ongoing AND present > 4 weeks)  Answer Assessment - Initial Assessment Questions Has athritis and fibromyalgia. Has been moving the last 2 weeks and has aggravated shoulder pain. Usually will get prednisone  when she has a flair up like this. Using heat and ice. Taking tylenol . Can not take ibuprofen really. Patient has family members that see Dr Duanne and wants to see him. Other providers at this office has no openings till April. Called [facility] spoke to Nanette who said she can not approve today because provider not in office- said to send CRM requesting visit. Unable to schedule patient but told she will get a call to schedule for New PT visit and that an open appointment may take a while. Advised UC. She states she lives close enough to her old doctor that she may drive a while to see her. 1. ONSET: When did the pain start?     Sept and has been worsening 2. LOCATION: Where is the  pain located?     Both shoulders 3. PAIN: How bad is the pain? (Scale 1-10; or mild, moderate, severe)     5 with no activity, 8-10 with activity 4. WORK OR EXERCISE: Has there been any recent work or exercise that involved this part of the body?     Has been moving and lifting things 5. CAUSE: What do you think is causing the shoulder pain?     Moving and lifting heavy things. 6. OTHER SYMPTOMS: Do you have any other symptoms? (e.g., neck pain, swelling, rash, fever, numbness, weakness)     Hard to sleep at night.  Protocols used: Shoulder Pain-A-AH

## 2024-05-09 ENCOUNTER — Telehealth: Payer: Self-pay | Admitting: Internal Medicine

## 2024-05-09 NOTE — Telephone Encounter (Signed)
 Patient called into office stating she is having trouble swallowing and something's feels as though they are going to get stuck in her throat. Put patient on wait list for Dr Cindie. Offered patient to make appointment with [facility] but patient wants to see Dr Cindie. Sent phone call to nurse.

## 2024-05-09 NOTE — Telephone Encounter (Signed)
 Sent to Dr Cindie in secure chat

## 2024-05-09 NOTE — Telephone Encounter (Signed)
 Returned the pt's call and was advised by her that she has had 2 episodes where food has gotten stuck in her throat and she wants to get in to see Dr Cindie. I advised the pt that Dr Cindie is booked and see can be scheduled to see another provider but she flat out refused. I advised the pt the only option left is to go to the ED to be evaluated if food getting stuck in her throat because this issue can turn for the worse really quick. The pt doesn't want to do that either. She was advised the her last appt was in 08/2022 and she had one 06/2022. Pt was advised that she really needs to be seen and the [facility] can get her scheduled for an EGD but she doesn't think this will solve her issue to come in here. Again I advised to go to the ED to be evaluated.

## 2024-05-10 NOTE — Telephone Encounter (Signed)
 Per Dr Cindie  from secure chat:  Genna to add to np or urgent spot thank you

## 2024-05-23 ENCOUNTER — Encounter: Payer: Self-pay | Admitting: Family Medicine

## 2024-05-23 ENCOUNTER — Ambulatory Visit: Admitting: Family Medicine

## 2024-05-23 VITALS — BP 115/78 | HR 71 | Ht 60.0 in | Wt 170.1 lb

## 2024-05-23 DIAGNOSIS — E66811 Obesity, class 1: Secondary | ICD-10-CM

## 2024-05-23 DIAGNOSIS — E785 Hyperlipidemia, unspecified: Secondary | ICD-10-CM | POA: Diagnosis not present

## 2024-05-23 DIAGNOSIS — Z0000 Encounter for general adult medical examination without abnormal findings: Secondary | ICD-10-CM | POA: Insufficient documentation

## 2024-05-23 DIAGNOSIS — Z130 Encounter for screening for diseases of the blood and blood-forming organs and certain disorders involving the immune mechanism: Secondary | ICD-10-CM

## 2024-05-23 DIAGNOSIS — F339 Major depressive disorder, recurrent, unspecified: Secondary | ICD-10-CM

## 2024-05-23 DIAGNOSIS — E063 Autoimmune thyroiditis: Secondary | ICD-10-CM

## 2024-05-23 DIAGNOSIS — Z79899 Other long term (current) drug therapy: Secondary | ICD-10-CM | POA: Diagnosis not present

## 2024-05-23 DIAGNOSIS — Z6833 Body mass index (BMI) 33.0-33.9, adult: Secondary | ICD-10-CM | POA: Diagnosis not present

## 2024-05-23 DIAGNOSIS — M810 Age-related osteoporosis without current pathological fracture: Secondary | ICD-10-CM

## 2024-05-23 DIAGNOSIS — F411 Generalized anxiety disorder: Secondary | ICD-10-CM | POA: Diagnosis not present

## 2024-05-23 DIAGNOSIS — K2100 Gastro-esophageal reflux disease with esophagitis, without bleeding: Secondary | ICD-10-CM

## 2024-05-23 DIAGNOSIS — Z131 Encounter for screening for diabetes mellitus: Secondary | ICD-10-CM

## 2024-05-23 DIAGNOSIS — Z1159 Encounter for screening for other viral diseases: Secondary | ICD-10-CM

## 2024-05-23 DIAGNOSIS — K449 Diaphragmatic hernia without obstruction or gangrene: Secondary | ICD-10-CM

## 2024-05-23 DIAGNOSIS — F4310 Post-traumatic stress disorder, unspecified: Secondary | ICD-10-CM | POA: Diagnosis not present

## 2024-05-23 LAB — CBC WITH DIFFERENTIAL/PLATELET
Absolute Lymphocytes: 2586 {cells}/uL (ref 850–3900)
Absolute Monocytes: 504 {cells}/uL (ref 200–950)
Basophils Absolute: 78 {cells}/uL (ref 0–200)
Basophils Relative: 1.3 %
Eosinophils Absolute: 240 {cells}/uL (ref 15–500)
Eosinophils Relative: 4 %
HCT: 41.5 % (ref 35.9–46.0)
Hemoglobin: 13.8 g/dL (ref 11.7–15.5)
MCH: 33.7 pg — ABNORMAL HIGH (ref 27.0–33.0)
MCHC: 33.3 g/dL (ref 31.6–35.4)
MCV: 101.2 fL (ref 81.4–101.7)
MPV: 10.4 fL (ref 7.5–12.5)
Monocytes Relative: 8.4 %
Neutro Abs: 2592 {cells}/uL (ref 1500–7800)
Neutrophils Relative %: 43.2 %
Platelets: 303 Thousand/uL (ref 140–400)
RBC: 4.1 Million/uL (ref 3.80–5.10)
RDW: 12.2 % (ref 11.0–15.0)
Total Lymphocyte: 43.1 %
WBC: 6 Thousand/uL (ref 3.8–10.8)

## 2024-05-23 LAB — COMPREHENSIVE METABOLIC PANEL WITH GFR
AG Ratio: 2.4 (calc) (ref 1.0–2.5)
ALT: 18 U/L (ref 6–29)
AST: 23 U/L (ref 10–35)
Albumin: 4.8 g/dL (ref 3.6–5.1)
Alkaline phosphatase (APISO): 59 U/L (ref 37–153)
BUN: 19 mg/dL (ref 7–25)
CO2: 29 mmol/L (ref 20–32)
Calcium: 9.6 mg/dL (ref 8.6–10.4)
Chloride: 103 mmol/L (ref 98–110)
Creat: 0.81 mg/dL (ref 0.50–1.05)
Globulin: 2 g/dL (ref 1.9–3.7)
Glucose, Bld: 96 mg/dL (ref 65–99)
Potassium: 4.4 mmol/L (ref 3.5–5.3)
Sodium: 139 mmol/L (ref 135–146)
Total Bilirubin: 0.6 mg/dL (ref 0.2–1.2)
Total Protein: 6.8 g/dL (ref 6.1–8.1)
eGFR: 80 mL/min/1.73m2

## 2024-05-23 LAB — HEPATITIS C ANTIBODY: Hepatitis C Ab: NONREACTIVE

## 2024-05-23 LAB — LIPID PANEL
Cholesterol: 301 mg/dL — ABNORMAL HIGH
HDL: 69 mg/dL
LDL Cholesterol (Calc): 186 mg/dL — ABNORMAL HIGH
Non-HDL Cholesterol (Calc): 232 mg/dL — ABNORMAL HIGH
Total CHOL/HDL Ratio: 4.4 (calc)
Triglycerides: 246 mg/dL — ABNORMAL HIGH

## 2024-05-23 LAB — TSH: TSH: 1.61 m[IU]/L (ref 0.40–4.50)

## 2024-05-23 LAB — HEMOGLOBIN A1C
Hgb A1c MFr Bld: 5.6 %
Mean Plasma Glucose: 114 mg/dL
eAG (mmol/L): 6.3 mmol/L

## 2024-05-23 LAB — VITAMIN D 25 HYDROXY (VIT D DEFICIENCY, FRACTURES): Vit D, 25-Hydroxy: 48 ng/mL (ref 30–100)

## 2024-05-23 LAB — MAGNESIUM: Magnesium: 2.4 mg/dL (ref 1.5–2.5)

## 2024-05-23 NOTE — Patient Instructions (Signed)
 It was great to meet you today and I'm excited to have you join the [facility] practice. I hope you had a positive experience today! If you feel so inclined, please feel free to recommend our practice to friends and family. Jeoffrey Barrio, FNP-C

## 2024-05-23 NOTE — Progress Notes (Signed)
 "  New Patient Office Visit  Patient ID: Carolyn Welch, Female   DOB: 03-14-1956 69 y.o. MRN: [id number]  Chief Complaint  Patient presents with   Establish Care   Subjective:     Carolyn Welch presents to establish care. Oriented to practice routines and expectations. PMH includes GERD, hiatal hernia, Hashimoto's hypothyroidism, HLD, osteoporosis, allergies, arthritis, fibromyalgia, alpha gal, anxiety and depression, insomnia, ADHD, OSA. Followed by GI at [facility], Ophthalmology.  HPI  Discussed the use of AI scribe software for clinical note transcription with the patient, who gave verbal consent to proceed.  History of Present Illness Carolyn Welch is a 69 year old female with alpha-gal allergy and Hashimoto's thyroiditis who presents for a comprehensive review of her medical history and management of multiple conditions.  She has a history of alpha-gal allergy, which developed while living in a tick-infested area, leading to significant anxiety and lifestyle changes such as avoiding walking on grass. Despite moving to a new home, she continues to experience panic when encountering ticks. She notes improvement in her condition, as she is now able to eat beef and pork again.  In July, she underwent emergency surgery for a ruptured appendix, which was a traumatic event. Her recovery was marked by significant anxiety, exacerbated by a tick encounter shortly after surgery. She describes the experience as 'horrible' and notes a prolonged recovery period.  She reports a history of Hashimoto's thyroiditis and is currently taking 100 mcg of Synthroid  daily. Previously, she was on 150 mcg of Synthroid  and 25 mcg of Cytomel. She feels her current dose may be too low, as she experiences cold intolerance, fatigue, and constipation. Her thyroid levels have not been checked recently.  She experiences significant anxiety, attributed to her past experiences with alpha-gal allergy and emergency  surgery. She has been diagnosed with PTSD by two professionals but is not currently seeing a therapist. She manages her anxiety with self-help books and self-talk techniques.  She reports severe acid reflux, which she attributes to stress and a hiatal hernia. She takes Gaviscon, Tums, and occasionally Prilosec  when symptoms are severe. She is concerned about the impact of Prilosec  on her calcium  and magnesium  levels, as she has osteopenia and is at risk for osteoporosis.  She has a history of high cholesterol, which she believes may be elevated due to recent dietary habits. She does not take medication for cholesterol. She drinks alcohol in moderation and does not smoke.  She has experienced over 150 chigger bites in the past, causing significant discomfort and preventing her from dressing for two months. She describes the experience as 'upsetting' and notes it occurred after her husband brought chiggers into their home.   Outpatient Encounter Medications as of 05/23/2024  Medication Sig   Alum Hydroxide-Mag Trisilicate (GAVISCON) 80-14.2 MG CHEW Chew 2 tablets by mouth daily as needed (indigestion).   levothyroxine  (SYNTHROID ) 100 MCG tablet Take 100 mcg by mouth daily before breakfast.   loratadine  (CLARITIN ) 10 MG tablet Take 10 mg by mouth daily.   mupirocin ointment (BACTROBAN) 2 % Apply 1 Application topically 3 (three) times daily as needed (rosacea).   spironolactone  (ALDACTONE ) 100 MG tablet Take 100 mg by mouth 2 (two) times daily.   traZODone  (DESYREL ) 100 MG tablet Take 100 mg by mouth at bedtime.   VALTREX  500 MG tablet Take 500 mg by mouth 2 (two) times daily.   [DISCONTINUED] calcium  carbonate (TUMS EX) 750 MG chewable tablet Chew 1 tablet (750 mg total) by mouth 2 (  two) times daily. As needed for heartburn (Patient taking differently: Chew 2 tablets by mouth as needed. As needed for heartburn)   [DISCONTINUED] omeprazole  (PRILOSEC  OTC) 20 MG tablet Take 1 tablet (20 mg total) by  mouth 2 (two) times daily.   [DISCONTINUED] amoxicillin -clavulanate (AUGMENTIN ) 875-125 MG tablet Take 1 tablet by mouth every 12 (twelve) hours. (Patient not taking: Reported on 05/23/2024)   [DISCONTINUED] azelastine  (ASTELIN ) 0.1 % nasal spray Place 1 spray into both nostrils 2 (two) times daily. Use in each nostril as directed (Patient not taking: Reported on 05/23/2024)   [DISCONTINUED] diphenhydrAMINE  (BENADRYL ) 25 MG tablet Take 50 mg by mouth every 6 (six) hours as needed for allergies. (Patient not taking: Reported on 05/23/2024)   [DISCONTINUED] EPINEPHrine 0.3 mg/0.3 mL IJ SOAJ injection Inject 0.3 mg into the muscle as needed for anaphylaxis. (Patient not taking: Reported on 05/23/2024)   [DISCONTINUED] fluconazole  (DIFLUCAN ) 150 MG tablet Take 1 tablet (150 mg total) by mouth once a week. (Patient not taking: Reported on 05/23/2024)   [DISCONTINUED] ondansetron  (ZOFRAN ) 4 MG tablet Take 1 tablet (4 mg total) by mouth every 6 (six) hours as needed for nausea. (Patient not taking: Reported on 05/23/2024)   [DISCONTINUED] oxyCODONE  (OXY IR/ROXICODONE ) 5 MG immediate release tablet Take 1 tablet (5 mg total) by mouth every 4 (four) hours as needed for moderate pain. (Patient not taking: Reported on 05/23/2024)   [DISCONTINUED] polyethylene glycol (MIRALAX  / GLYCOLAX ) 17 g packet Take 17 g by mouth daily. (Patient not taking: Reported on 05/23/2024)   [DISCONTINUED] predniSONE  (DELTASONE ) 20 MG tablet Take 2 tablets (40 mg total) by mouth daily with breakfast. (Patient not taking: Reported on 05/23/2024)   No facility-administered encounter medications on file as of 05/23/2024.    Past Medical History:  Diagnosis Date   Allergy    Allergy to alpha-gal    Anxiety    Arthritis    Constipation    Fibromyalgia    GERD (gastroesophageal reflux disease)    Hayfever    Hyperlipidemia    Hypothyroidism    Osteoporosis     Past Surgical History:  Procedure Laterality Date   APPENDECTOMY     BALLOON  DILATION N/A 03/11/2021   Procedure: BALLOON DILATION;  Surgeon: Cindie Carlin POUR, DO;  [facility];  Service: Endoscopy;  Laterality: N/A;   BALLOON DILATION N/A 02/02/2022   Procedure: BALLOON DILATION;  Surgeon: Cindie Carlin POUR, DO;  Location: [facility];  Service: Endoscopy;  Laterality: N/A;   BIOPSY  03/11/2021   Procedure: BIOPSY;  Surgeon: Cindie Carlin POUR, DO;  Location: [facility];  Service: Endoscopy;;   BIOPSY  02/02/2022   Procedure: BIOPSY;  Surgeon: Cindie Carlin POUR, DO;  [facility];  Service: Endoscopy;;  gastric   CARPAL TUNNEL RELEASE Bilateral    CATARACT EXTRACTION Left    CESAREAN SECTION     x3   COLONOSCOPY WITH PROPOFOL  N/A 09/09/2020   Surgeon: Cindie Carlin POUR, DO;  Nonbleeding internal hemorrhoids, diverticulosis in the sigmoid and descending colon.  Repeat in 10 years.   ESOPHAGOGASTRODUODENOSCOPY (EGD) WITH PROPOFOL  N/A 03/11/2021   Surgeon: Cindie Carlin POUR, DO; small hiatal hernia, grade C reflux esophagitis, benign-appearing esophageal stenosis dilated, gastritis biopsied. Pathology with reactive gastropathy with intestinal metaplasia, negative for H. pylori.   ESOPHAGOGASTRODUODENOSCOPY (EGD) WITH PROPOFOL  N/A 02/02/2022   Procedure: ESOPHAGOGASTRODUODENOSCOPY (EGD) WITH PROPOFOL ;  Surgeon: Cindie Carlin POUR, DO;  [facility]: [facility];  Service: Endoscopy;  Laterality: N/A;  8:45am   FRACTURE SURGERY     ROBOTIC ASSISTED LAPAROSCOPIC LYSIS OF ADHESION  11/18/2022   Procedure: XI ROBOTIC ASSISTED LAPAROSCOPIC LYSIS OF ADHESION;  Surgeon: Evonnie Dorothyann LABOR, DO;  Location: [facility];  Service: General;;   SINUS EXPLORATION     TONSILLECTOMY     TUBAL LIGATION     XI ROBOTIC LAPAROSCOPIC ASSISTED APPENDECTOMY N/A 11/18/2022   Procedure: XI ROBOTIC LAPAROSCOPIC ASSISTED APPENDECTOMY WITH PARTIAL OMENTECTOMY;  Surgeon: Evonnie Dorothyann LABOR, DO;  Location: [facility];  Service: General;  Laterality: N/A;    Family  History  Problem Relation Age of Onset   Cancer Mother    Hypertension Mother    Depression Mother    Anxiety disorder Mother    Multiple sclerosis Father    Esophageal cancer Maternal Aunt    Colon cancer Paternal Grandmother        diagnosed in her 24s    Social History   Socioeconomic History   Marital status: Married    Spouse name: Not on file   Number of children: Not on file   Years of education: Not on file   Highest education level: Associate degree: academic program  Occupational History   Not on file  Tobacco Use   Smoking status: Former    Current packs/day: 0.00    Average packs/day: 0.5 packs/day for 13.0 years (6.5 ttl pk-yrs)    Types: Cigarettes    Quit date: 05/18/1978    Years since quitting: 46.0   Smokeless tobacco: Never  Vaping Use   Vaping status: Never Used  Substance and Sexual Activity   Alcohol use: Yes    Alcohol/week: 5.0 standard drinks of alcohol    Types: 1 Glasses of wine, 4 Cans of beer per week    Comment: Sometimes a little more and sometimes a little less.... like holidays and vacations, etc   Drug use: Never   Sexual activity: Not Currently    Birth control/protection: Post-menopausal    Comment: It hurts to have sex because of vaginal atrophy  Other Topics Concern   Not on file  Social History Narrative   Not on file   Social Drivers of Health   Tobacco Use: Medium Risk (05/23/2024)   Patient History    Smoking Tobacco Use: Former    Smokeless Tobacco Use: Never    Passive Exposure: Not on file  Financial Resource Strain: Low Risk (05/20/2024)   Overall Financial Resource Strain (CARDIA)    Difficulty of Paying Living Expenses: Not hard at all  Food Insecurity: No Food Insecurity (05/20/2024)   Epic    Worried About Radiation Protection Practitioner of Food in the Last Year: Never true    Ran Out of Food in the Last Year: Never true  Transportation Needs: No Transportation Needs (05/20/2024)   Epic    Lack of Transportation (Medical): No    Lack  of Transportation (Non-Medical): No  Physical Activity: Inactive (05/20/2024)   Exercise Vital Sign    Days of Exercise per Week: 0 days    Minutes of Exercise per Session: Not on file  Stress: Stress Concern Present (05/20/2024)   [employer] of Occupational Health - Occupational Stress Questionnaire    Feeling of Stress: Very much  Social Connections: Unknown (05/20/2024)   Social Connection and Isolation Panel    Frequency of Communication with Friends and Family: Once a week    Frequency of Social Gatherings with Friends and Family: Patient declined    Attends Religious Services: Never  Active Member of Clubs or Organizations: No    Attends Banker Meetings: Not on file    Marital Status: Married  Intimate Partner Violence: Not At Risk (11/19/2022)   Humiliation, Afraid, Rape, and Kick questionnaire    Fear of Current or Ex-Partner: No    Emotionally Abused: No    Physically Abused: No    Sexually Abused: No  Depression (PHQ2-9): Not on file  Alcohol Screen: Low Risk (05/20/2024)   Alcohol Screen    Last Alcohol Screening Score (AUDIT): 3  Housing: Low Risk (05/20/2024)   Epic    Unable to Pay for Housing in the Last Year: No    Number of Times Moved in the Last Year: 1    Homeless in the Last Year: No  Utilities: Not At Risk (11/19/2022)   [facility] Utilities    Threatened with loss of utilities: No  Health Literacy: Not on file    Review of Systems  Constitutional:  Positive for malaise/fatigue.  HENT: Negative.    Eyes: Negative.   Respiratory: Negative.    Cardiovascular: Negative.   Gastrointestinal:  Positive for constipation and heartburn.  Genitourinary: Negative.   Musculoskeletal: Negative.   Skin: Negative.   Neurological: Negative.   Endo/Heme/Allergies: Negative.   Psychiatric/Behavioral:  The patient is nervous/anxious.   All other systems reviewed and are negative.     Objective:    BP 115/78   Pulse 71   Ht 5' (1.524 m)   Wt 170 lb 1 oz  (77.1 kg)   SpO2 97%   BMI 33.21 kg/m   Physical Exam Vitals and nursing note reviewed.  Constitutional:      Appearance: Normal appearance. She is obese.  HENT:     Head: Normocephalic and atraumatic.     Right Ear: Tympanic membrane, ear canal and external ear normal.     Left Ear: Tympanic membrane, ear canal and external ear normal.     Nose: Nose normal.     Mouth/Throat:     Mouth: Mucous membranes are moist.     Pharynx: Oropharynx is clear.  Eyes:     Extraocular Movements: Extraocular movements intact.     Conjunctiva/sclera: Conjunctivae normal.     Pupils: Pupils are equal, round, and reactive to light.  Cardiovascular:     Rate and Rhythm: Normal rate and regular rhythm.     Pulses: Normal pulses.     Heart sounds: Normal heart sounds.  Pulmonary:     Effort: Pulmonary effort is normal.     Breath sounds: Normal breath sounds.  Abdominal:     General: Bowel sounds are normal.     Palpations: Abdomen is soft.  Musculoskeletal:        General: Normal range of motion.     Cervical back: Normal range of motion and neck supple.  Skin:    General: Skin is warm and dry.     Capillary Refill: Capillary refill takes less than 2 seconds.  Neurological:     General: No focal deficit present.     Mental Status: She is alert and oriented to person, place, and time. Mental status is at baseline.  Psychiatric:        Mood and Affect: Mood normal.        Behavior: Behavior normal.        Thought Content: Thought content normal.        Judgment: Judgment normal.          Assessment &  Plan:   Problem List Items Addressed This Visit       Respiratory   Hiatal hernia     Digestive   Gastroesophageal reflux disease   Relevant Orders   Magnesium      Endocrine   Hypothyroidism   Relevant Orders   TSH     Musculoskeletal and Integument   Osteoporosis   Relevant Orders   VITAMIN D  25 Hydroxy (Vit-D Deficiency, Fractures)     Other   Hyperlipidemia    Relevant Orders   Lipid panel   Physical exam, annual - Primary   Relevant Orders   CBC with Differential/Platelet   Comprehensive metabolic panel with GFR   Lipid panel   TSH   Hemoglobin A1c   VITAMIN D  25 Hydroxy (Vit-D Deficiency, Fractures)   Magnesium    Generalized anxiety disorder   Depression, recurrent   Class 1 obesity with serious comorbidity and body mass index (BMI) of 33.0 to 33.9 in adult   Relevant Orders   CBC with Differential/Platelet   Comprehensive metabolic panel with GFR   Lipid panel   TSH   Hemoglobin A1c   PTSD (post-traumatic stress disorder)   Other Visit Diagnoses       Screening, anemia, deficiency, iron       Relevant Orders   CBC with Differential/Platelet     Screening for diabetes mellitus       Relevant Orders   Hemoglobin A1c     Need for hepatitis C screening test       Relevant Orders   Hepatitis C antibody     Medication management       Relevant Orders   Magnesium        Assessment and Plan Assessment & Plan Adult Wellness Visit Routine wellness visit focused on health maintenance and screenings. - Ordered CBC, metabolic panel, vitamin D , cholesterol, thyroid level, diabetes numbers, and hepatitis C screening. - Schedule mammogram and DEXA scan. - Request records from imaging and bone density centers.  Gastroesophageal reflux disease with hiatal hernia Severe GERD with hiatal hernia, exacerbated by stress. Current management includes Gaviscon, Tums, and occasional Prilosec . Concerns about long-term Prilosec  use due to potential impact on calcium  and magnesium  levels. Upcoming evaluation for potential surgical intervention. - Continue Gaviscon and Tums. - Use Prilosec  as needed for severe symptoms. - Keep appointments with Gastroenterology. Upcoming hiatal hernia repair planned.  Hypothyroidism due to Hashimoto thyroiditis Hypothyroidism managed with Synthroid  100 mcg daily. Symptoms suggestive of low thyroid function.  Current dose may be suboptimal. - Checked thyroid levels today. - Consider dose adjustment based on thyroid function tests.  Generalized anxiety disorder and PTSD Anxiety exacerbated by recent stressors. Previous PTSD diagnosis. Currently not on medication or therapy. Uses self-help strategies. - Discussed potential benefits of therapy or counseling. - Encouraged continuation of self-help strategies.  Hyperlipidemia Recent dietary indiscretions. Husband on cholesterol medication. - Ordered cholesterol level test. - Provided dietary guidance.  Osteoporosis Osteopenia with concern for progression. Previous issues with Evenity due to alpha-gal allergy. Bone density monitoring ongoing. - Continue monitoring with DEXA scan.  Obesity, class 1 Recent weight gain due to poor dietary habits and lifestyle changes. Acknowledges need for healthier eating and exercise. - Provided dietary guidance.    Return in about 6 months (around 11/20/2024) for chronic follow-up with labs 1 week prior.   Jeoffrey GORMAN Barrio, FNP [facility]     "

## 2024-05-28 ENCOUNTER — Ambulatory Visit: Payer: Self-pay | Admitting: Family Medicine

## 2024-05-31 ENCOUNTER — Other Ambulatory Visit: Payer: Self-pay | Admitting: Family Medicine

## 2024-05-31 MED ORDER — ROSUVASTATIN CALCIUM 10 MG PO TABS: 10.0000 mg | ORAL_TABLET | Freq: Every day | ORAL | 3 refills | Status: AC

## 2024-06-05 ENCOUNTER — Ambulatory Visit: Admitting: Internal Medicine

## 2024-06-05 ENCOUNTER — Telehealth (INDEPENDENT_AMBULATORY_CARE_PROVIDER_SITE_OTHER): Payer: Self-pay

## 2024-06-05 ENCOUNTER — Encounter: Payer: Self-pay | Admitting: Internal Medicine

## 2024-06-05 VITALS — BP 115/73 | HR 66 | Temp 97.8°F | Ht 61.0 in | Wt 167.1 lb

## 2024-06-05 DIAGNOSIS — K449 Diaphragmatic hernia without obstruction or gangrene: Secondary | ICD-10-CM | POA: Diagnosis not present

## 2024-06-05 DIAGNOSIS — K219 Gastro-esophageal reflux disease without esophagitis: Secondary | ICD-10-CM

## 2024-06-05 DIAGNOSIS — R1310 Dysphagia, unspecified: Secondary | ICD-10-CM

## 2024-06-05 DIAGNOSIS — R1319 Other dysphagia: Secondary | ICD-10-CM

## 2024-06-05 DIAGNOSIS — K2100 Gastro-esophageal reflux disease with esophagitis, without bleeding: Secondary | ICD-10-CM

## 2024-06-05 DIAGNOSIS — Z91014 Allergy to mammalian meats: Secondary | ICD-10-CM

## 2024-06-05 NOTE — Telephone Encounter (Signed)
 Spoke with patient, scheduled EGD/DIL for 06/21/2024 at 9am. Instructions sent to mychart.

## 2024-06-05 NOTE — Progress Notes (Signed)
 "   Referring Provider: Katrinka Aquas, MD Primary Care Physician:  Kayla Jeoffrey RAMAN, FNP Primary GI:  Dr. Cindie  Chief Complaint  Patient presents with   Follow-up    Patient here today to follow up on her Thalia and hiatal hernia. Patient is having issues with dysphagia. She says she takes Nexium prn, only as it causes issues with blurry vision. She has been using gaviscon and Tums prn.     HPI:   Carolyn Welch is a 69 y.o. female who presents to the clinic today for follow-up.  Last seen in our office 2024.  Chronic GERD: Previously on omeprazole  20 mg twice daily.  Also previously tried Dexilant , did not tolerate due to side effects.  Trialed and failed Protonix  twice daily.  Continues to have breakthrough symptoms of burning. Given samples of Voquezna at one point but did not take these. Taking esomeprazole as needed though reports it causes issues with blurry vision.  Also using Gaviscon and Tums as needed.  Esophageal dysphagia: Worsening.  Feels like food gets stuck in her substernal region. Does note increase stress in her life due to selling and building new house.    Alpha gal: Had cut out dairy completely and changed cosmetics and swelling and abdominal bloating improved. Started adding a little dairy back here and there and is doing well. Still with some tongue swelling, but this is improving.  No shortness of breath. Previous followed with allergy.  Prior workup: EGD 02/02/2022 3 cm hiatal hernia, mild Schatzki's ring status post dilation.  Mild gastritis biopsies negative for H. pylori.  GES 07/29/2022 WNL  CT abdomen pelvis with contrast 11/18/2022 findings of acute appendicitis with reactive terminal ileitis and colitis.  Moderate size hiatal hernia with fluid in the distal esophagus likely reflecting reflux.  Underwent appendectomy 11/18/22  Past Medical History:  Diagnosis Date   Allergy    Allergy to alpha-gal    Anxiety    Arthritis    Constipation    Fibromyalgia     GERD (gastroesophageal reflux disease)    Hayfever    Hyperlipidemia    Hypothyroidism    Osteoporosis     Past Surgical History:  Procedure Laterality Date   APPENDECTOMY     BALLOON DILATION N/A 03/11/2021   Procedure: BALLOON DILATION;  Surgeon: Cindie Carlin POUR, DO;  Location: [facility];  Service: Endoscopy;  Laterality: N/A;   BALLOON DILATION N/A 02/02/2022   Procedure: BALLOON DILATION;  Surgeon: Cindie Carlin POUR, DO;  Location: [facility];  Service: Endoscopy;  Laterality: N/A;   BIOPSY  03/11/2021   Procedure: BIOPSY;  Surgeon: Cindie Carlin POUR, DO;  Location: [facility];  Service: Endoscopy;;   BIOPSY  02/02/2022   Procedure: BIOPSY;  Surgeon: Cindie Carlin POUR, DO;  Location: [facility];  Service: Endoscopy;;  gastric   CARPAL TUNNEL RELEASE Bilateral    CATARACT EXTRACTION Left    CESAREAN SECTION     x3   COLONOSCOPY WITH PROPOFOL  N/A 09/09/2020   Surgeon: Cindie Carlin POUR, DO;  Nonbleeding internal hemorrhoids, diverticulosis in the sigmoid and descending colon.  Repeat in 10 years.   ESOPHAGOGASTRODUODENOSCOPY (EGD) WITH PROPOFOL  N/A 03/11/2021   Surgeon: Cindie Carlin POUR, DO; small hiatal hernia, grade C reflux esophagitis, benign-appearing esophageal stenosis dilated, gastritis biopsied. Pathology with reactive gastropathy with intestinal metaplasia, negative for H. pylori.   ESOPHAGOGASTRODUODENOSCOPY (EGD) WITH PROPOFOL  N/A 02/02/2022   Procedure: ESOPHAGOGASTRODUODENOSCOPY (EGD) WITH PROPOFOL ;  Surgeon: Cindie Carlin POUR, DO;  Location: [facility];  Service: Endoscopy;  Laterality: N/A;  8:45am   FRACTURE SURGERY     ROBOTIC ASSISTED LAPAROSCOPIC LYSIS OF ADHESION  11/18/2022   Procedure: XI ROBOTIC ASSISTED LAPAROSCOPIC LYSIS OF ADHESION;  Surgeon: Evonnie Dorothyann LABOR, DO;  Location: [facility];  Service: General;;   SINUS EXPLORATION     TONSILLECTOMY     TUBAL LIGATION     XI ROBOTIC LAPAROSCOPIC ASSISTED APPENDECTOMY N/A 11/18/2022    Procedure: XI ROBOTIC LAPAROSCOPIC ASSISTED APPENDECTOMY WITH PARTIAL OMENTECTOMY;  Surgeon: Evonnie Dorothyann LABOR, DO;  Location: [facility];  Service: General;  Laterality: N/A;    Current Outpatient Medications  Medication Sig Dispense Refill   Alum Hydroxide-Mag Trisilicate (GAVISCON) 80-14.2 MG CHEW Chew 2 tablets by mouth daily as needed (indigestion).     esomeprazole (NEXIUM) 20 MG packet Take 20 mg by mouth as needed.     levothyroxine  (SYNTHROID ) 100 MCG tablet Take 100 mcg by mouth daily before breakfast.     loratadine  (CLARITIN ) 10 MG tablet Take 10 mg by mouth daily.     mupirocin ointment (BACTROBAN) 2 % Apply 1 Application topically 3 (three) times daily as needed (rosacea).     rosuvastatin  (CRESTOR ) 10 MG tablet Take 1 tablet (10 mg total) by mouth daily. 90 tablet 3   spironolactone  (ALDACTONE ) 100 MG tablet Take 100 mg by mouth 2 (two) times daily.     traZODone  (DESYREL ) 100 MG tablet Take 100 mg by mouth at bedtime.     VALTREX  500 MG tablet Take 500 mg by mouth 2 (two) times daily.     No current facility-administered medications for this visit.    Allergies as of 06/05/2024 - Review Complete 06/05/2024  Allergen Reaction Noted   Alpha-gal Anaphylaxis and Swelling 03/05/2021   Cephalosporins Hives 08/27/2020   Clindamycin Hives 12/06/2018   Codeine Itching 02/01/2013   Doxycycline Hives 02/01/2013   Sulfa antibiotics  03/17/2021    Family History  Problem Relation Age of Onset   Cancer Mother    Hypertension Mother    Depression Mother    Anxiety disorder Mother    Multiple sclerosis Father    Esophageal cancer Maternal Aunt    Colon cancer Paternal Grandmother        diagnosed in her 6s    Social History   Socioeconomic History   Marital status: Married    Spouse name: Not on file   Number of children: Not on file   Years of education: Not on file   Highest education level: Associate degree: academic program  Occupational History   Not on file   Tobacco Use   Smoking status: Former    Current packs/day: 0.00    Average packs/day: 0.5 packs/day for 13.0 years (6.5 ttl pk-yrs)    Types: Cigarettes    Quit date: 05/18/1978    Years since quitting: 46.0   Smokeless tobacco: Never  Vaping Use   Vaping status: Never Used  Substance and Sexual Activity   Alcohol use: Yes    Alcohol/week: 5.0 standard drinks of alcohol    Types: 1 Glasses of wine, 4 Cans of beer per week    Comment: Sometimes a little more and sometimes a little less.... like holidays and vacations, etc   Drug use: Never   Sexual activity: Not Currently    Birth control/protection: Post-menopausal    Comment: It hurts to have sex because of vaginal atrophy  Other Topics Concern   Not on file  Social  History Narrative   Not on file   Social Drivers of Health   Tobacco Use: Medium Risk (06/05/2024)   Patient History    Smoking Tobacco Use: Former    Smokeless Tobacco Use: Never    Passive Exposure: Not on file  Financial Resource Strain: Low Risk (05/20/2024)   Overall Financial Resource Strain (CARDIA)    Difficulty of Paying Living Expenses: Not hard at all  Food Insecurity: No Food Insecurity (05/20/2024)   Epic    Worried About Programme Researcher, Broadcasting/film/video in the Last Year: Never true    Ran Out of Food in the Last Year: Never true  Transportation Needs: No Transportation Needs (05/20/2024)   Epic    Lack of Transportation (Medical): No    Lack of Transportation (Non-Medical): No  Physical Activity: Inactive (05/20/2024)   Exercise Vital Sign    Days of Exercise per Week: 0 days    Minutes of Exercise per Session: Not on file  Stress: Stress Concern Present (05/20/2024)   [employer] of Occupational Health - Occupational Stress Questionnaire    Feeling of Stress: Very much  Social Connections: Unknown (05/20/2024)   Social Connection and Isolation Panel    Frequency of Communication with Friends and Family: Once a week    Frequency of Social Gatherings with  Friends and Family: Patient declined    Attends Religious Services: Never    Database Administrator or Organizations: No    Attends Engineer, Structural: Not on file    Marital Status: Married  Depression (PHQ2-9): Not on file  Alcohol Screen: Low Risk (05/20/2024)   Alcohol Screen    Last Alcohol Screening Score (AUDIT): 3  Housing: Low Risk (05/20/2024)   Epic    Unable to Pay for Housing in the Last Year: No    Number of Times Moved in the Last Year: 1    Homeless in the Last Year: No  Utilities: Not At Risk (11/19/2022)   [facility] Utilities    Threatened with loss of utilities: No  Health Literacy: Not on file    Subjective: Review of Systems  Constitutional:  Negative for chills and fever.  HENT:  Negative for congestion and hearing loss.   Eyes:  Negative for blurred vision and double vision.  Respiratory:  Negative for cough and shortness of breath.   Cardiovascular:  Negative for chest pain and palpitations.  Gastrointestinal:  Positive for heartburn. Negative for abdominal pain, blood in stool, constipation, diarrhea, melena and vomiting.       Dysphagia  Genitourinary:  Negative for dysuria and urgency.  Musculoskeletal:  Negative for joint pain and myalgias.  Skin:  Negative for itching and rash.  Neurological:  Negative for dizziness and headaches.  Psychiatric/Behavioral:  Negative for depression. The patient is not nervous/anxious.      Objective: BP 115/73 (BP Location: Left Arm, Patient Position: Sitting, Cuff Size: Normal)   Pulse 66   Temp 97.8 F (36.6 C) (Temporal)   Ht 5' 1 (1.549 m)   Wt 167 lb 1.6 oz (75.8 kg)   BMI 31.57 kg/m  Physical Exam Constitutional:      Appearance: Normal appearance.  HENT:     Head: Normocephalic and atraumatic.  Eyes:     Extraocular Movements: Extraocular movements intact.     Conjunctiva/sclera: Conjunctivae normal.  Cardiovascular:     Rate and Rhythm: Normal rate and regular rhythm.  Pulmonary:     Effort:  Pulmonary effort is normal.  Breath sounds: Normal breath sounds.  Abdominal:     General: Bowel sounds are normal.     Palpations: Abdomen is soft.  Musculoskeletal:        General: No swelling. Normal range of motion.     Cervical back: Normal range of motion and neck supple.  Skin:    General: Skin is warm and dry.     Coloration: Skin is not jaundiced.  Neurological:     General: No focal deficit present.     Mental Status: She is alert and oriented to person, place, and time.  Psychiatric:        Mood and Affect: Mood normal.        Behavior: Behavior normal.      Assessment/Plan:  1.  Chronic GERD, hiatal hernia: Previously on omeprazole  20 mg twice daily.  Also previously tried Dexilant , did not tolerate due to side effects.  Trialed and failed Protonix  twice daily.  Continues to have breakthrough symptoms of burning. Given samples of Voquezna at one point but did not take these. Taking esomeprazole as needed though reports it causes issues with blurry vision.  Also using Gaviscon and Tums as needed.  Will schedule for EGD with possible esophageal dilation to evaluate for peptic ulcer disease, esophagitis, gastritis, H. Pylori, duodenitis, or other. Will also evaluate for esophageal stricture, Schatzki's ring, esophageal web or other.   The risks including infection, bleed, or perforation as well as benefits, limitations, alternatives and imponderables have been reviewed with the patient. Potential for esophageal dilation, biopsy, etc. have also been reviewed.  Questions have been answered. All parties agreeable.  May benefit from referral for hiatal hernia repair pending endoscopic evaluation.   2.  Esophageal dysphagia: Worsening, EGD with dilation as above.    3.  Alpha gal: continue to avoid red meat pork and lamb.   Patient appears quite anxious in office today. Recommended she discuss with her PCP in this regard. May benefit from anxiolytic therapy.   Follow up  after EGD.    06/05/2024 11:02 AM   Disclaimer: This note was dictated with voice recognition software. Similar sounding words can inadvertently be transcribed and may not be corrected upon review.  "

## 2024-06-05 NOTE — Telephone Encounter (Signed)
 Prior shara is not required

## 2024-06-05 NOTE — Patient Instructions (Signed)
 We will schedule you for upper endoscopy to further evaluate your symptoms.  I may elect to stretch your esophagus depending on findings.  Would recommend you schedule an appointment with your PCP in regards to your worsening anxiety.  It was very nice seeing you again today.  Dr. Cindie

## 2024-06-16 ENCOUNTER — Telehealth: Payer: Self-pay | Admitting: *Deleted

## 2024-06-16 ENCOUNTER — Encounter: Payer: Self-pay | Admitting: *Deleted

## 2024-06-16 NOTE — Telephone Encounter (Signed)
 Pt's procedure has been rescheduled to 07/04/24 due to provider being out of town. Updated instructions sent via mychart.

## 2024-06-29 ENCOUNTER — Other Ambulatory Visit (HOSPITAL_COMMUNITY)

## 2024-07-04 ENCOUNTER — Encounter (HOSPITAL_COMMUNITY): Admission: RE | Payer: Self-pay | Source: Home / Self Care

## 2024-07-04 ENCOUNTER — Ambulatory Visit (HOSPITAL_COMMUNITY): Admission: RE | Admit: 2024-07-04 | Source: Home / Self Care | Admitting: Internal Medicine

## 2024-07-06 ENCOUNTER — Ambulatory Visit: Admitting: Gastroenterology

## 2024-09-05 ENCOUNTER — Ambulatory Visit: Admitting: Family Medicine
